# Patient Record
Sex: Male | Born: 1960 | Race: White | Hispanic: No | Marital: Married | Smoking: Never smoker
Health system: Southern US, Community
[De-identification: ages and names within clinical notes are randomized; demographics above are authoritative.]

## PROBLEM LIST (undated history)

## (undated) DIAGNOSIS — K219 Gastro-esophageal reflux disease without esophagitis: Secondary | ICD-10-CM

## (undated) DIAGNOSIS — IMO0001 Reserved for inherently not codable concepts without codable children: Secondary | ICD-10-CM

## (undated) DIAGNOSIS — K4090 Unilateral inguinal hernia, without obstruction or gangrene, not specified as recurrent: Secondary | ICD-10-CM

## (undated) DIAGNOSIS — Z9889 Other specified postprocedural states: Secondary | ICD-10-CM

## (undated) DIAGNOSIS — R011 Cardiac murmur, unspecified: Secondary | ICD-10-CM

## (undated) DIAGNOSIS — R112 Nausea with vomiting, unspecified: Secondary | ICD-10-CM

## (undated) HISTORY — PX: NO PAST SURGERIES: SHX2092

## (undated) HISTORY — DX: Other specified postprocedural states: Z98.890

## (undated) HISTORY — PX: INGUINAL HERNIA REPAIR: SUR1180

## (undated) HISTORY — PX: HERNIA REPAIR: SHX51

## (undated) HISTORY — PX: COLONOSCOPY: SHX174

## (undated) HISTORY — DX: Nausea with vomiting, unspecified: R11.2

## (undated) HISTORY — DX: Unilateral inguinal hernia, without obstruction or gangrene, not specified as recurrent: K40.90

---

## 2009-01-18 HISTORY — PX: COLONOSCOPY: SHX174

## 2009-11-19 ENCOUNTER — Encounter (INDEPENDENT_AMBULATORY_CARE_PROVIDER_SITE_OTHER): Payer: Self-pay | Admitting: *Deleted

## 2009-12-17 ENCOUNTER — Encounter (INDEPENDENT_AMBULATORY_CARE_PROVIDER_SITE_OTHER): Payer: Self-pay | Admitting: *Deleted

## 2009-12-18 ENCOUNTER — Ambulatory Visit: Payer: Self-pay | Admitting: Internal Medicine

## 2010-01-01 ENCOUNTER — Ambulatory Visit: Payer: Self-pay | Admitting: Internal Medicine

## 2010-02-17 NOTE — Letter (Signed)
Summary: [facility] Instructions  [facility]  [address] [place], [place] [address]   Phone: [phone]  Fax: [phone]       BRASON BERTHELOT    May 05, 1960    MRN: [id number]        Procedure Day Dorna Bloom:  thursday 01/01/2010      Arrival Time:  9:00 am     Procedure Time:  10:00 am     Location of Procedure:                    _x _  [facility] (4th Floor)                        PREPARATION FOR COLONOSCOPY WITH MOVIPREP   Starting 5 days prior to your procedure Saturday 10/10 do not eat nuts, seeds, popcorn, corn, beans, peas,  salads, or any raw vegetables.  Do not take any fiber supplements (e.g. Metamucil, Citrucel, and Benefiber).  THE DAY BEFORE YOUR PROCEDURE         DATE: Wednesday 12/14  1.  Drink clear liquids the entire day-NO SOLID FOOD  2.  Do not drink anything colored red or purple.  Avoid juices with pulp.  No orange juice.  3.  Drink at least 64 oz. (8 glasses) of fluid/clear liquids during the day to prevent dehydration and help the prep work efficiently.  CLEAR LIQUIDS INCLUDE: Water Jello Ice Popsicles Tea (sugar ok, no milk/cream) Powdered fruit flavored drinks Coffee (sugar ok, no milk/cream) Gatorade Juice: apple, white grape, white cranberry  Lemonade Clear bullion, consomm, broth Carbonated beverages (any kind) Strained chicken noodle soup Hard Candy                             4.  In the morning, mix first dose of MoviPrep solution:    Empty 1 Pouch A and 1 Pouch B into the disposable container    Add lukewarm drinking water to the top line of the container. Mix to dissolve    Refrigerate (mixed solution should be used within 24 hrs)  5.  Begin drinking the prep at 5:00 p.m. The MoviPrep container is divided by 4 marks.   Every 15 minutes drink the solution down to the next mark (approximately 8 oz) until the full liter is complete.   6.  Follow completed prep with 16 oz of clear liquid of your  choice (Nothing red or purple).  Continue to drink clear liquids until bedtime.  7.  Before going to bed, mix second dose of MoviPrep solution:    Empty 1 Pouch A and 1 Pouch B into the disposable container    Add lukewarm drinking water to the top line of the container. Mix to dissolve    Refrigerate  THE DAY OF YOUR PROCEDURE      DATE: Thursday 12/15  Beginning at 5:00 a.m. (5 hours before procedure):         1. Every 15 minutes, drink the solution down to the next mark (approx 8 oz) until the full liter is complete.  2. Follow completed prep with 16 oz. of clear liquid of your choice.    3. You may drink clear liquids until 8:00 am (2 HOURS BEFORE PROCEDURE).   MEDICATION INSTRUCTIONS  Unless otherwise instructed, you should take regular prescription medications with a small sip of water   as early as possible the  morning of your procedure.           OTHER INSTRUCTIONS  You will need a responsible adult at least 51 years of age to accompany you and drive you home.   This person must remain in the waiting room during your procedure.  Wear loose fitting clothing that is easily removed.  Leave jewelry and other valuables at home.  However, you may wish to bring a book to read or  an iPod/MP3 player to listen to music as you wait for your procedure to start.  Remove all body piercing jewelry and leave at home.  Total time from sign-in until discharge is approximately 2-3 hours.  You should go home directly after your procedure and rest.  You can resume normal activities the  day after your procedure.  The day of your procedure you should not:   Drive   Make legal decisions   Operate machinery   Drink alcohol   Return to work  You will receive specific instructions about eating, activities and medications before you leave.    The above instructions have been reviewed and explained to me by   Ezra Sites RN  December 18, 2009 1:10 PM     I fully  understand and can verbalize these instructions _____________________________ Date _________

## 2010-02-17 NOTE — Letter (Signed)
Summary: Pre Visit Letter Revised  [facility]  [address] [place], [place] [address]   Phone: [phone]  Fax: [phone]        11/19/2009 MRN: [id number] Kirk Campbell [address] [place], [place]  [address]             Procedure Date:  01-01-10   Welcome to the Gastroenterology Division at [facility].    You are scheduled to see a nurse for your pre-procedure visit on 12-18-09 at 1:00p.m. on the 3rd floor at [facility], [address]  We ask that you try to arrive at our office 15 minutes prior to your appointment time to allow for check-in.  Please take a minute to review the attached form.  If you answer "Yes" to one or more of the questions on the first page, we ask that you call the person listed at your earliest opportunity.  If you answer "No" to all of the questions, please complete the rest of the form and bring it to your appointment.    Your nurse visit will consist of discussing your medical and surgical history, your immediate family medical history, and your medications.   If you are unable to list all of your medications on the form, please bring the medication bottles to your appointment and we will list them.  We will need to be aware of both prescribed and over the counter drugs.  We will need to know exact dosage information as well.    Please be prepared to read and sign documents such as consent forms, a financial agreement, and acknowledgement forms.  If necessary, and with your consent, a friend or relative is welcome to sit-in on the nurse visit with you.  Please bring your insurance card so that we may make a copy of it.  If your insurance requires a referral to see a specialist, please bring your referral form from your primary care physician.  No co-pay is required for this nurse visit.     If you cannot keep your appointment, please call [phone] to cancel or reschedule prior to your appointment date.  This  allows [place] the opportunity to schedule an appointment for another patient in need of care.    Thank you for choosing [facility] for your medical needs.  We appreciate the opportunity to care for you.  Please visit [place] at our website  to learn more about our practice.  Sincerely, The Gastroenterology Division

## 2010-02-17 NOTE — Miscellaneous (Signed)
Summary: [facility]  Clinical Lists Changes  Medications: Added new medication of MOVIPREP 100 GM  SOLR (PEG-KCL-NACL-NASULF-NA ASC-C) As per prep instructions. - Signed Rx of MOVIPREP 100 GM  SOLR (PEG-KCL-NACL-NASULF-NA ASC-C) As per prep instructions.;  #1 x 0;  Signed;  Entered by: Ezra Sites RN;  Authorized by: Iva Boop MD, [facility];  Method used: Electronically to [facility] [address] [place], [place], [place]  [address], Ph: [phone] or [phone], Fax: [phone] Observations: Added new observation of NKA: T (12/18/2009 12:28)    Prescriptions: MOVIPREP 100 GM  SOLR (PEG-KCL-NACL-NASULF-NA ASC-C) As per prep instructions.  #1 x 0   Entered by:   Ezra Sites RN   Authorized by:   Iva Boop MD, [facility]   Signed by:   Ezra Sites RN on 12/18/2009   Method used:   Electronically to        [facility]       [address]       [place], [place]  [address]       Ph: [phone] or [phone]       Fax: [phone]   RxID:   [id number]

## 2010-02-19 NOTE — Procedures (Signed)
Summary: Colonoscopy  Patient: Kirk Campbell Note: All result statuses are Final unless otherwise noted.  Tests: (1) Colonoscopy (COL)   COL Colonoscopy           DONE     [facility]     [address]     [place], [place]  [address]           COLONOSCOPY PROCEDURE REPORT           PATIENT:  Kirk Campbell, Kirk Campbell  MR#:  [id number]     BIRTHDATE:  1960/07/02, 49 yrs. old  GENDER:  male     ENDOSCOPIST:  Iva Boop, MD, [facility]     REF. BY:  Theadora Rama, M.D.     PROCEDURE DATE:  01/01/2010     PROCEDURE:  Colonoscopy 295[address]     ASA CLASS:  Class I     INDICATIONS:  Routine Risk Screening     MEDICATIONS:   Fentanyl 75 mcg IV, Versed 6 mg IV           DESCRIPTION OF PROCEDURE:   After the risks benefits and     alternatives of the procedure were thoroughly explained, informed     consent was obtained.  Digital rectal exam was performed and     revealed no abnormalities and normal prostate.   The [id number] [id number]     endoscope was introduced through the anus and advanced to the     cecum, which was identified by both the appendix and ileocecal     valve, without limitations.  The quality of the prep was     excellent, using MoviPrep.  The instrument was then slowly     withdrawn as the colon was fully examined. Insertion: 1:55 minutes     Withdrawal: 7:45 minutes     <<PROCEDUREIMAGES>>           FINDINGS:  Moderate diverticulosis was found in the sigmoid colon.     This was otherwise a normal examination of the colon.     Retroflexed views in the rectum revealed no abnormalities.    The     scope was then withdrawn from the patient and the procedure     completed.           COMPLICATIONS:  None     ENDOSCOPIC IMPRESSION:     1) Moderate diverticulosis in the sigmoid colon     2) Otherwise normal examination with excellent prep           REPEAT EXAM:  In 10 year(s) for routine screening colonoscopy.           Iva Boop, MD, [facility]           CC:  [facility] Theadora Rama, MD) and The Patient                 n.     eSIGNED:   Iva Boop at 01/01/2010 10:56 AM           Thornell Mule, [id number]  Note: An exclamation mark (!) indicates a result that was not dispersed into the flowsheet. Document Creation Date: 01/01/2010 10:56 AM _______________________________________________________________________  (1) Order result status: Final Collection or observation date-time: 01/01/2010 10:43 Requested date-time:  Receipt date-time:  Reported date-time:  Referring Physician:   Ordering Physician: Stan Head [phone]) Specimen Source:  Source: Launa Grill Order Number: [id number] Lab site:   Appended Document: Colonoscopy    Clinical Lists Changes  Observations: Added new observation of COLONNXTDUE: 12/2019 (01/01/2010 11:00)

## 2012-10-30 DIAGNOSIS — M25559 Pain in unspecified hip: Secondary | ICD-10-CM | POA: Insufficient documentation

## 2014-10-15 ENCOUNTER — Other Ambulatory Visit: Payer: Self-pay | Admitting: Orthopaedic Surgery

## 2014-10-15 DIAGNOSIS — M542 Cervicalgia: Secondary | ICD-10-CM

## 2014-10-16 ENCOUNTER — Other Ambulatory Visit: Payer: Self-pay

## 2014-10-17 ENCOUNTER — Other Ambulatory Visit: Payer: Self-pay | Admitting: *Deleted

## 2014-10-17 ENCOUNTER — Encounter: Payer: Self-pay | Admitting: *Deleted

## 2014-10-17 ENCOUNTER — Telehealth: Payer: Self-pay | Admitting: Internal Medicine

## 2014-10-17 NOTE — Telephone Encounter (Signed)
Records rec via fax from [facility], placed in chart prep bin for 10/21/14 appointment with Nevin Bloodgood Ross,MD

## 2014-10-21 ENCOUNTER — Encounter: Payer: Self-pay | Admitting: Internal Medicine

## 2014-10-21 ENCOUNTER — Ambulatory Visit (INDEPENDENT_AMBULATORY_CARE_PROVIDER_SITE_OTHER): Payer: BLUE CROSS/BLUE SHIELD | Admitting: Internal Medicine

## 2014-10-21 VITALS — BP 122/100 | HR 57 | Ht 70.0 in | Wt 183.1 lb

## 2014-10-21 DIAGNOSIS — R0989 Other specified symptoms and signs involving the circulatory and respiratory systems: Secondary | ICD-10-CM | POA: Diagnosis not present

## 2014-10-21 DIAGNOSIS — R011 Cardiac murmur, unspecified: Secondary | ICD-10-CM

## 2014-10-21 NOTE — Patient Instructions (Signed)
Your physician recommends that you continue on your current medications as directed. Please refer to the Current Medication list given to you today. Your physician has requested that you have an echocardiogram. Echocardiography is a painless test that uses sound waves to create images of your heart. It provides your doctor with information about the size and shape of your heart and how well your heart's chambers and valves are working. This procedure takes approximately one hour. There are no restrictions for this procedure.  Your physician has requested that you have a carotid duplex. This test is an ultrasound of the carotid arteries in your neck. It looks at blood flow through these arteries that supply the brain with blood. Allow one hour for this exam. There are no restrictions or special instructions.  Your physician wants you to follow-up in: [place].  You will receive a reminder letter in the mail two months in advance. If you don't receive a letter, please call our office to schedule the follow-up appointment.

## 2014-10-21 NOTE — Progress Notes (Signed)
   Cardiology Office Note   Date:  10/21/2014   ID:  Kirk Campbell, DOB 08/15/1960, MRN [id number]  PCP:  Myrlene Broker, MD  Cardiologist:   Dorris Carnes, MD   No chief complaint on file.     History of Present Illness: Kirk Campbell is a 54 y.o. male with a history of Murmur  He is followed in IM  Murmur was   commented on in past    Pt walks some   [place] [employer]   No PNd   Occasional CP without activity  Lasts seconds        Current Outpatient Prescriptions  Medication Sig Dispense Refill  . aspirin 81 MG tablet Take 81 mg by mouth daily.    . cetirizine (ZYRTEC) 10 MG tablet Take 10 mg by mouth daily.    Marland Kitchen esomeprazole (NEXIUM) 40 MG capsule Take 40 mg by mouth daily.  3  . Multiple Vitamin (MULTI VITAMIN MENS PO) Take 1 tablet by mouth daily.     No current facility-administered medications for this visit.    Allergies:   Review of patient's allergies indicates no known allergies.   Past Medical History  Diagnosis Date  . Right inguinal hernia     Past Surgical History  Procedure Laterality Date  . No past surgeries       Social History:  The patient  reports that he has never smoked. He has never used smokeless tobacco. He reports that he does not drink alcohol.   Family History:  The patient's family history is negative for Cancer, Heart attack, Diabetes, Hyperlipidemia, and Hypertension.    ROS:  Please see the history of present illness. All other systems are reviewed and  Negative to the above problem except as noted.    PHYSICAL EXAM: VS:  BP 122/100 mmHg  Pulse 57  Ht 5\' 10"  (1.778 m)  Wt 183 lb 1.9 oz (83.063 kg)  BMI 26.28 kg/m2  GEN: Well nourished, well developed, in no acute distress HEENT: normal Neck: no JVD, Murmur L neck or masses Cardiac: RRR; Gr III/VI systolic murmur  No, rubs, or gallops,no edema  Respiratory:  clear to auscultation bilaterally, normal work of breathing GI: soft, nontender, nondistended, + BS  No  hepatomegaly  MS: no deformity Moving all extremities   Skin: warm and dry, no rash Neuro:  Strength and sensation are intact Psych: euthymic mood, full affect   EKG:  EKG is ordered today.  SB 57 bpm     Lipid Panel No results found for: CHOL, TRIG, HDL, CHOLHDL, VLDL, LDLCALC, LDLDIRECT    Wt Readings from Last 3 Encounters:  10/21/14 183 lb 1.9 oz (83.063 kg)      ASSESSMENT AND PLAN:  1  Murmur  Prominent on exam  Most likely due to bicuspid AV  It is very prominent in L neck Would set up for echo to evaluate  Also set up for carotid USN   2  CP  Atypical  I do not think cardiac  Disposition:   FU with me tentatively in 1 year    Signed, Dorris Carnes, MD  10/21/2014 10:46 AM    [address], [place], [place]  [address] Phone: [phone]; Fax: [phone]

## 2014-11-07 ENCOUNTER — Other Ambulatory Visit: Payer: Self-pay | Admitting: Internal Medicine

## 2014-11-07 DIAGNOSIS — R0989 Other specified symptoms and signs involving the circulatory and respiratory systems: Secondary | ICD-10-CM

## 2014-11-15 ENCOUNTER — Other Ambulatory Visit: Payer: Self-pay | Admitting: Internal Medicine

## 2014-11-15 ENCOUNTER — Ambulatory Visit (HOSPITAL_COMMUNITY)
Admission: RE | Admit: 2014-11-15 | Discharge: 2014-11-15 | Disposition: A | Discharge status: Home / Self Care | Payer: BLUE CROSS/BLUE SHIELD | Source: Ambulatory Visit | Attending: Cardiovascular Disease | Admitting: Cardiovascular Disease

## 2014-11-15 ENCOUNTER — Ambulatory Visit (HOSPITAL_BASED_OUTPATIENT_CLINIC_OR_DEPARTMENT_OTHER): Payer: BLUE CROSS/BLUE SHIELD

## 2014-11-15 ENCOUNTER — Inpatient Hospital Stay (HOSPITAL_COMMUNITY): Admission: RE | Admit: 2014-11-15 | Payer: BLUE CROSS/BLUE SHIELD | Source: Ambulatory Visit

## 2014-11-15 ENCOUNTER — Other Ambulatory Visit: Payer: Self-pay

## 2014-11-15 DIAGNOSIS — I5189 Other ill-defined heart diseases: Secondary | ICD-10-CM | POA: Insufficient documentation

## 2014-11-15 DIAGNOSIS — R011 Cardiac murmur, unspecified: Secondary | ICD-10-CM

## 2014-11-15 DIAGNOSIS — I352 Nonrheumatic aortic (valve) stenosis with insufficiency: Secondary | ICD-10-CM | POA: Insufficient documentation

## 2014-11-15 DIAGNOSIS — R0989 Other specified symptoms and signs involving the circulatory and respiratory systems: Secondary | ICD-10-CM | POA: Diagnosis not present

## 2014-11-15 DIAGNOSIS — I77810 Thoracic aortic ectasia: Secondary | ICD-10-CM | POA: Insufficient documentation

## 2014-11-15 DIAGNOSIS — I340 Nonrheumatic mitral (valve) insufficiency: Secondary | ICD-10-CM | POA: Diagnosis not present

## 2014-12-02 ENCOUNTER — Other Ambulatory Visit (HOSPITAL_COMMUNITY): Payer: Self-pay | Admitting: Orthopaedic Surgery

## 2014-12-06 ENCOUNTER — Telehealth: Payer: Self-pay | Admitting: *Deleted

## 2014-12-06 NOTE — Telephone Encounter (Signed)
Received request for pre op clearance from [employer]. Signed by Dr. [name]. Placed in nurse fax bin in medical records to be faxed.

## 2014-12-10 ENCOUNTER — Telehealth: Payer: Self-pay | Admitting: Internal Medicine

## 2014-12-10 NOTE — Telephone Encounter (Signed)
[employer]  Rn from [facility] calling to follow up on surg clearance- has not received anything from Friday. Please call back and discuss.

## 2014-12-10 NOTE — Telephone Encounter (Signed)
Called and left voicemail that clearance form is scanned into EPIC under media tab. Advised to call back if further questions or if they are unable to see this form we can fax again.

## 2014-12-20 ENCOUNTER — Encounter (HOSPITAL_COMMUNITY): Payer: Self-pay

## 2014-12-20 ENCOUNTER — Encounter (HOSPITAL_COMMUNITY)
Admission: RE | Admit: 2014-12-20 | Discharge: 2014-12-20 | Disposition: A | Discharge status: Home / Self Care | Payer: BLUE CROSS/BLUE SHIELD | Source: Ambulatory Visit | Attending: Orthopaedic Surgery | Admitting: Orthopaedic Surgery

## 2014-12-20 DIAGNOSIS — M47812 Spondylosis without myelopathy or radiculopathy, cervical region: Secondary | ICD-10-CM | POA: Diagnosis not present

## 2014-12-20 DIAGNOSIS — Z01812 Encounter for preprocedural laboratory examination: Secondary | ICD-10-CM | POA: Diagnosis not present

## 2014-12-20 DIAGNOSIS — Z01818 Encounter for other preprocedural examination: Secondary | ICD-10-CM | POA: Insufficient documentation

## 2014-12-20 HISTORY — DX: Gastro-esophageal reflux disease without esophagitis: K21.9

## 2014-12-20 HISTORY — DX: Reserved for inherently not codable concepts without codable children: IMO0001

## 2014-12-20 LAB — COMPREHENSIVE METABOLIC PANEL
ALT: 18 U/L (ref 17–63)
AST: 30 U/L (ref 15–41)
Albumin: 4.5 g/dL (ref 3.5–5.0)
Alkaline Phosphatase: 53 U/L (ref 38–126)
Anion gap: 8 (ref 5–15)
BUN: 17 mg/dL (ref 6–20)
CHLORIDE: 107 mmol/L (ref 101–111)
CO2: 25 mmol/L (ref 22–32)
Calcium: 9.6 mg/dL (ref 8.9–10.3)
Creatinine, Ser: 0.9 mg/dL (ref 0.61–1.24)
Glucose, Bld: 93 mg/dL (ref 65–99)
POTASSIUM: 4.1 mmol/L (ref 3.5–5.1)
Sodium: 140 mmol/L (ref 135–145)
Total Bilirubin: 0.6 mg/dL (ref 0.3–1.2)
Total Protein: 7 g/dL (ref 6.5–8.1)

## 2014-12-20 LAB — URINALYSIS, ROUTINE W REFLEX MICROSCOPIC
Bilirubin Urine: NEGATIVE
Glucose, UA: NEGATIVE mg/dL
Hgb urine dipstick: NEGATIVE
KETONES UR: 15 mg/dL — AB
LEUKOCYTES UA: NEGATIVE
NITRITE: NEGATIVE
PROTEIN: NEGATIVE mg/dL
Specific Gravity, Urine: 1.012 (ref 1.005–1.030)
pH: 5.5 (ref 5.0–8.0)

## 2014-12-20 LAB — CBC
HCT: 45.7 % (ref 39.0–52.0)
Hemoglobin: 15.3 g/dL (ref 13.0–17.0)
MCH: 31 pg (ref 26.0–34.0)
MCHC: 33.5 g/dL (ref 30.0–36.0)
MCV: 92.5 fL (ref 78.0–100.0)
Platelets: 227 10*3/uL (ref 150–400)
RBC: 4.94 MIL/uL (ref 4.22–5.81)
RDW: 13.5 % (ref 11.5–15.5)
WBC: 7.3 10*3/uL (ref 4.0–10.5)

## 2014-12-20 LAB — SURGICAL PCR SCREEN
MRSA, PCR: NEGATIVE
Staphylococcus aureus: NEGATIVE

## 2014-12-20 LAB — PROTIME-INR
INR: 1.06 (ref 0.00–1.49)
PROTHROMBIN TIME: 14 s (ref 11.6–15.2)

## 2014-12-20 LAB — APTT: aPTT: 31 s (ref 24–37)

## 2014-12-20 MED ORDER — CHLORHEXIDINE GLUCONATE 4 % EX LIQD: 60.0000 mL | Freq: Once | CUTANEOUS | Status: DC

## 2014-12-20 NOTE — Pre-Procedure Instructions (Signed)
    Kirk Campbell  12/20/2014      [id number] [address], [place] [address] Phone: [phone] Fax: [phone]    Your procedure is scheduled on December 27, 2014.  Report to [facility] Admitting at 5:30 A.M.  Call this number if you have problems the morning of surgery:  [phone]   Remember:  Do not eat food or drink liquids after midnight.  Take these medicines the morning of surgery with A SIP OF WATER : cetirizine (ZYRTEC), esomeprazole (NEXIUM)   STOP HERBAL MEDICATIONS, ASPIRIN  AS OF TODAY   Do not wear jewelry.  Do not wear lotions, powders, or colognes.  You may wear deodorant.  Men may shave face and neck.  Do not bring valuables to the hospital.  [facility] is not responsible for any belongings or valuables.  Contacts, dentures or bridgework may not be worn into surgery.  Leave your suitcase in the car.  After surgery it may be brought to your room.  For patients admitted to the hospital, discharge time will be determined by your treatment team.  Patients discharged the day of surgery will not be allowed to drive home.   Name and phone number of your driver:    Special instructions:  "PREPARING FOR SURGERY"  Please read over the following fact sheets that you were given. Pain Booklet, Coughing and Deep Breathing and Surgical Site Infection Prevention

## 2014-12-26 MED ORDER — CEFAZOLIN SODIUM-DEXTROSE 2-3 GM-% IV SOLR
2.0000 g | INTRAVENOUS | Status: AC
  Administered 2014-12-27: 2 g via INTRAVENOUS
  Filled 2014-12-26: qty 50

## 2014-12-27 ENCOUNTER — Observation Stay (HOSPITAL_COMMUNITY)
Admission: RE | Admit: 2014-12-27 | Discharge: 2014-12-28 | Disposition: A | Discharge status: Home / Self Care | Payer: BLUE CROSS/BLUE SHIELD | Source: Ambulatory Visit | Attending: Orthopaedic Surgery | Admitting: Orthopaedic Surgery

## 2014-12-27 ENCOUNTER — Ambulatory Visit (HOSPITAL_COMMUNITY): Payer: BLUE CROSS/BLUE SHIELD

## 2014-12-27 ENCOUNTER — Encounter (HOSPITAL_COMMUNITY): Payer: Self-pay | Admitting: *Deleted

## 2014-12-27 ENCOUNTER — Ambulatory Visit (HOSPITAL_COMMUNITY): Payer: BLUE CROSS/BLUE SHIELD | Admitting: Anesthesiology

## 2014-12-27 ENCOUNTER — Encounter (HOSPITAL_COMMUNITY)
Admission: RE | Disposition: A | Discharge status: Home / Self Care | Payer: Self-pay | Source: Ambulatory Visit | Attending: Orthopaedic Surgery

## 2014-12-27 DIAGNOSIS — Z419 Encounter for procedure for purposes other than remedying health state, unspecified: Secondary | ICD-10-CM

## 2014-12-27 DIAGNOSIS — Z7982 Long term (current) use of aspirin: Secondary | ICD-10-CM | POA: Insufficient documentation

## 2014-12-27 DIAGNOSIS — M47812 Spondylosis without myelopathy or radiculopathy, cervical region: Secondary | ICD-10-CM | POA: Diagnosis not present

## 2014-12-27 DIAGNOSIS — M50223 Other cervical disc displacement at C6-C7 level: Principal | ICD-10-CM | POA: Insufficient documentation

## 2014-12-27 DIAGNOSIS — Z981 Arthrodesis status: Secondary | ICD-10-CM

## 2014-12-27 DIAGNOSIS — M50222 Other cervical disc displacement at C5-C6 level: Secondary | ICD-10-CM | POA: Insufficient documentation

## 2014-12-27 HISTORY — DX: Cardiac murmur, unspecified: R01.1

## 2014-12-27 HISTORY — PX: ANTERIOR CERVICAL DECOMP/DISCECTOMY FUSION: SHX1161

## 2014-12-27 SURGERY — ANTERIOR CERVICAL DECOMPRESSION/DISCECTOMY FUSION 2 LEVELS
Anesthesia: General

## 2014-12-27 MED ORDER — FENTANYL CITRATE (PF) 250 MCG/5ML IJ SOLN
INTRAMUSCULAR | Status: AC
  Filled 2014-12-27: qty 5

## 2014-12-27 MED ORDER — SUCCINYLCHOLINE CHLORIDE 20 MG/ML IJ SOLN
INTRAMUSCULAR | Status: AC
  Filled 2014-12-27: qty 1

## 2014-12-27 MED ORDER — CEFAZOLIN SODIUM 1-5 GM-% IV SOLN
1.0000 g | Freq: Three times a day (TID) | INTRAVENOUS | Status: AC
  Administered 2014-12-27 (×2): 1 g via INTRAVENOUS
  Filled 2014-12-27 (×3): qty 50

## 2014-12-27 MED ORDER — PROMETHAZINE HCL 25 MG/ML IJ SOLN
12.5000 mg | Freq: Four times a day (QID) | INTRAMUSCULAR | Status: DC | PRN
  Administered 2014-12-27: 12.5 mg via INTRAVENOUS
  Filled 2014-12-27 (×2): qty 1

## 2014-12-27 MED ORDER — ONDANSETRON HCL 4 MG/2ML IJ SOLN
4.0000 mg | INTRAMUSCULAR | Status: DC | PRN
  Administered 2014-12-27: 4 mg via INTRAVENOUS
  Filled 2014-12-27: qty 2

## 2014-12-27 MED ORDER — PHENOL 1.4 % MT LIQD: 1.0000 | OROMUCOSAL | Status: DC | PRN

## 2014-12-27 MED ORDER — LIDOCAINE HCL (CARDIAC) 20 MG/ML IV SOLN
INTRAVENOUS | Status: AC
  Filled 2014-12-27: qty 5

## 2014-12-27 MED ORDER — MENTHOL 3 MG MT LOZG: 1.0000 | LOZENGE | OROMUCOSAL | Status: DC | PRN

## 2014-12-27 MED ORDER — OXYCODONE-ACETAMINOPHEN 7.5-325 MG PO TABS: 1.0000 | ORAL_TABLET | Freq: Four times a day (QID) | ORAL | Status: DC | PRN

## 2014-12-27 MED ORDER — HYDROMORPHONE HCL 1 MG/ML IJ SOLN
0.2500 mg | INTRAMUSCULAR | Status: DC | PRN
  Administered 2014-12-27: 0.5 mg via INTRAVENOUS

## 2014-12-27 MED ORDER — GLYCOPYRROLATE 0.2 MG/ML IJ SOLN
INTRAMUSCULAR | Status: AC
  Filled 2014-12-27: qty 2

## 2014-12-27 MED ORDER — LACTATED RINGERS IV SOLN
INTRAVENOUS | Status: DC | PRN
  Administered 2014-12-27 (×2): via INTRAVENOUS

## 2014-12-27 MED ORDER — OXYCODONE-ACETAMINOPHEN 5-325 MG PO TABS
1.0000 | ORAL_TABLET | Freq: Four times a day (QID) | ORAL | Status: DC | PRN
  Administered 2014-12-27 – 2014-12-28 (×3): 2 via ORAL
  Filled 2014-12-27 (×3): qty 2

## 2014-12-27 MED ORDER — SODIUM CHLORIDE 0.9 % IJ SOLN: 3.0000 mL | INTRAMUSCULAR | Status: DC | PRN

## 2014-12-27 MED ORDER — LIDOCAINE HCL (CARDIAC) 20 MG/ML IV SOLN
INTRAVENOUS | Status: DC | PRN
  Administered 2014-12-27: 60 mg via INTRAVENOUS

## 2014-12-27 MED ORDER — HYDROMORPHONE HCL 1 MG/ML IJ SOLN
INTRAMUSCULAR | Status: AC
  Administered 2014-12-27: 1 mg via INTRAVENOUS
  Filled 2014-12-27: qty 1

## 2014-12-27 MED ORDER — SODIUM CHLORIDE 0.9 % IV SOLN: 250.0000 mL | INTRAVENOUS | Status: DC

## 2014-12-27 MED ORDER — PROPOFOL 10 MG/ML IV BOLUS
INTRAVENOUS | Status: DC | PRN
  Administered 2014-12-27: 130 mg via INTRAVENOUS
  Administered 2014-12-27: 30 mg via INTRAVENOUS

## 2014-12-27 MED ORDER — ACETAMINOPHEN 650 MG RE SUPP
650.0000 mg | RECTAL | Status: DC | PRN
Start: 2014-12-27 — End: 2014-12-28

## 2014-12-27 MED ORDER — ROCURONIUM BROMIDE 50 MG/5ML IV SOLN
INTRAVENOUS | Status: AC
  Filled 2014-12-27: qty 1

## 2014-12-27 MED ORDER — 0.9 % SODIUM CHLORIDE (POUR BTL) OPTIME
TOPICAL | Status: DC | PRN
  Administered 2014-12-27: 1000 mL

## 2014-12-27 MED ORDER — FENTANYL CITRATE (PF) 100 MCG/2ML IJ SOLN
INTRAMUSCULAR | Status: DC | PRN
  Administered 2014-12-27: 100 ug via INTRAVENOUS
  Administered 2014-12-27: 50 ug via INTRAVENOUS
  Administered 2014-12-27 (×2): 100 ug via INTRAVENOUS

## 2014-12-27 MED ORDER — PROMETHAZINE HCL 25 MG/ML IJ SOLN
INTRAMUSCULAR | Status: AC
  Filled 2014-12-27: qty 1

## 2014-12-27 MED ORDER — BUPIVACAINE HCL (PF) 0.5 % IJ SOLN
INTRAMUSCULAR | Status: AC
  Filled 2014-12-27: qty 30

## 2014-12-27 MED ORDER — ROCURONIUM BROMIDE 100 MG/10ML IV SOLN
INTRAVENOUS | Status: DC | PRN
  Administered 2014-12-27: 50 mg via INTRAVENOUS
  Administered 2014-12-27: 20 mg via INTRAVENOUS

## 2014-12-27 MED ORDER — THROMBIN 20000 UNITS EX SOLR
CUTANEOUS | Status: AC
  Filled 2014-12-27: qty 20000

## 2014-12-27 MED ORDER — PHENYLEPHRINE HCL 10 MG/ML IJ SOLN
10.0000 mg | INTRAVENOUS | Status: DC | PRN
  Administered 2014-12-27: 10 ug/min via INTRAVENOUS

## 2014-12-27 MED ORDER — DOCUSATE SODIUM 100 MG PO CAPS
100.0000 mg | ORAL_CAPSULE | Freq: Two times a day (BID) | ORAL | Status: DC
  Administered 2014-12-27 – 2014-12-28 (×2): 100 mg via ORAL
  Filled 2014-12-27 (×2): qty 1

## 2014-12-27 MED ORDER — POTASSIUM CHLORIDE IN NACL 20-0.45 MEQ/L-% IV SOLN
INTRAVENOUS | Status: DC
  Administered 2014-12-27: 14:00:00 via INTRAVENOUS
  Filled 2014-12-27 (×2): qty 1000

## 2014-12-27 MED ORDER — MIDAZOLAM HCL 5 MG/5ML IJ SOLN
INTRAMUSCULAR | Status: DC | PRN
  Administered 2014-12-27: 2 mg via INTRAVENOUS

## 2014-12-27 MED ORDER — ARTIFICIAL TEARS OP OINT
TOPICAL_OINTMENT | OPHTHALMIC | Status: DC | PRN
  Administered 2014-12-27: 1 via OPHTHALMIC

## 2014-12-27 MED ORDER — PANTOPRAZOLE SODIUM 40 MG PO TBEC
40.0000 mg | DELAYED_RELEASE_TABLET | Freq: Every day | ORAL | Status: DC
Start: 2014-12-27 — End: 2014-12-28
  Administered 2014-12-27 – 2014-12-28 (×2): 40 mg via ORAL
  Filled 2014-12-27 (×2): qty 1

## 2014-12-27 MED ORDER — EPHEDRINE SULFATE 50 MG/ML IJ SOLN
INTRAMUSCULAR | Status: AC
  Filled 2014-12-27: qty 1

## 2014-12-27 MED ORDER — ACETAMINOPHEN 325 MG PO TABS
650.0000 mg | ORAL_TABLET | ORAL | Status: DC | PRN
  Administered 2014-12-28: 650 mg via ORAL
  Filled 2014-12-27: qty 2

## 2014-12-27 MED ORDER — METHOCARBAMOL 500 MG PO TABS: 500.0000 mg | ORAL_TABLET | Freq: Four times a day (QID) | ORAL | Status: DC | PRN

## 2014-12-27 MED ORDER — HYDROMORPHONE HCL 1 MG/ML IJ SOLN
0.5000 mg | INTRAMUSCULAR | Status: DC | PRN
Start: 2014-12-27 — End: 2014-12-28
  Administered 2014-12-27 – 2014-12-28 (×2): 1 mg via INTRAVENOUS
  Filled 2014-12-27 (×2): qty 1

## 2014-12-27 MED ORDER — BUPIVACAINE-EPINEPHRINE (PF) 0.25% -1:200000 IJ SOLN
INTRAMUSCULAR | Status: AC
  Filled 2014-12-27: qty 30

## 2014-12-27 MED ORDER — ARTIFICIAL TEARS OP OINT
TOPICAL_OINTMENT | OPHTHALMIC | Status: AC
  Filled 2014-12-27: qty 3.5

## 2014-12-27 MED ORDER — ONDANSETRON HCL 4 MG/2ML IJ SOLN
INTRAMUSCULAR | Status: DC | PRN
  Administered 2014-12-27: 4 mg via INTRAVENOUS

## 2014-12-27 MED ORDER — SODIUM CHLORIDE 0.9 % IJ SOLN: 3.0000 mL | Freq: Two times a day (BID) | INTRAMUSCULAR | Status: DC

## 2014-12-27 MED ORDER — SODIUM CHLORIDE 0.9 % IJ SOLN
INTRAMUSCULAR | Status: AC
  Filled 2014-12-27: qty 10

## 2014-12-27 MED ORDER — POLYETHYLENE GLYCOL 3350 17 G PO PACK: 17.0000 g | PACK | Freq: Every day | ORAL | Status: DC | PRN

## 2014-12-27 MED ORDER — METHOCARBAMOL 500 MG PO TABS
500.0000 mg | ORAL_TABLET | Freq: Four times a day (QID) | ORAL | Status: DC | PRN
  Administered 2014-12-27: 500 mg via ORAL
  Filled 2014-12-27 (×2): qty 1

## 2014-12-27 MED ORDER — BUPIVACAINE-EPINEPHRINE 0.25% -1:200000 IJ SOLN
INTRAMUSCULAR | Status: DC | PRN
  Administered 2014-12-27: 6 mL

## 2014-12-27 MED ORDER — SUGAMMADEX SODIUM 200 MG/2ML IV SOLN
INTRAVENOUS | Status: AC
  Filled 2014-12-27: qty 2

## 2014-12-27 MED ORDER — METHOCARBAMOL 1000 MG/10ML IJ SOLN
500.0000 mg | Freq: Four times a day (QID) | INTRAVENOUS | Status: DC | PRN
  Administered 2014-12-27: 500 mg via INTRAVENOUS
  Filled 2014-12-27 (×2): qty 5

## 2014-12-27 MED ORDER — THROMBIN 20000 UNITS EX SOLR
CUTANEOUS | Status: DC | PRN
  Administered 2014-12-27: 20 mL via TOPICAL

## 2014-12-27 MED ORDER — PROMETHAZINE HCL 25 MG/ML IJ SOLN
6.2500 mg | Freq: Once | INTRAMUSCULAR | Status: AC
  Administered 2014-12-27: 6.25 mg via INTRAVENOUS

## 2014-12-27 MED ORDER — LORATADINE 10 MG PO TABS
10.0000 mg | ORAL_TABLET | Freq: Every day | ORAL | Status: DC
Start: 2014-12-27 — End: 2014-12-28
  Administered 2014-12-28: 10 mg via ORAL
  Filled 2014-12-27 (×2): qty 1

## 2014-12-27 MED ORDER — MIDAZOLAM HCL 2 MG/2ML IJ SOLN
INTRAMUSCULAR | Status: AC
  Filled 2014-12-27: qty 2

## 2014-12-27 MED ORDER — SUGAMMADEX SODIUM 200 MG/2ML IV SOLN
INTRAVENOUS | Status: DC | PRN
  Administered 2014-12-27: 175 mg via INTRAVENOUS

## 2014-12-27 MED ORDER — ONDANSETRON HCL 4 MG/2ML IJ SOLN
INTRAMUSCULAR | Status: AC
  Filled 2014-12-27: qty 2

## 2014-12-27 SURGICAL SUPPLY — 54 items
BENZOIN TINCTURE PRP APPL 2/3 (GAUZE/BANDAGES/DRESSINGS) ×2 IMPLANT
BIT DRILL SRG 14X2.2XFLT CHK (BIT) ×1 IMPLANT
BIT DRL SRG 14X2.2XFLT CHK (BIT) ×1
BLADE SURG ROTATE 9660 (MISCELLANEOUS) IMPLANT
BONE CERV LORDOTIC 14.5X12X7 (Bone Implant) ×4 IMPLANT
BUR ROUND FLUTED 4 SOFT TCH (BURR) IMPLANT
COLLAR CERV LO CONTOUR FIRM DE (SOFTGOODS) IMPLANT
CORDS BIPOLAR (ELECTRODE) ×2 IMPLANT
COVER SURGICAL LIGHT HANDLE (MISCELLANEOUS) ×2 IMPLANT
CRADLE DONUT ADULT HEAD (MISCELLANEOUS) ×2 IMPLANT
DRAPE C-ARM 42X72 X-RAY (DRAPES) ×2 IMPLANT
DRAPE MICROSCOPE LEICA (MISCELLANEOUS) ×2 IMPLANT
DRAPE PROXIMA HALF (DRAPES) ×2 IMPLANT
DRILL BIT SKYLINE 14MM (BIT) ×2
DRSG EMULSION OIL 3X3 NADH (GAUZE/BANDAGES/DRESSINGS) ×2 IMPLANT
DURAPREP 6ML APPLICATOR 50/CS (WOUND CARE) ×2 IMPLANT
ELECT COATED BLADE 2.86 ST (ELECTRODE) ×2 IMPLANT
ELECT REM PT RETURN 9FT ADLT (ELECTROSURGICAL) ×2
ELECTRODE REM PT RTRN 9FT ADLT (ELECTROSURGICAL) ×1 IMPLANT
EVACUATOR 1/8 PVC DRAIN (DRAIN) ×2 IMPLANT
GAUZE SPONGE 4X4 12PLY STRL (GAUZE/BANDAGES/DRESSINGS) ×2 IMPLANT
GAUZE XEROFORM 1X8 LF (GAUZE/BANDAGES/DRESSINGS) IMPLANT
GLOVE BIOGEL PI IND STRL 8 (GLOVE) ×2 IMPLANT
GLOVE BIOGEL PI INDICATOR 8 (GLOVE) ×2
GLOVE ORTHO TXT STRL SZ7.5 (GLOVE) ×4 IMPLANT
GOWN STRL REUS W/ TWL LRG LVL3 (GOWN DISPOSABLE) ×1 IMPLANT
GOWN STRL REUS W/ TWL XL LVL3 (GOWN DISPOSABLE) ×1 IMPLANT
GOWN STRL REUS W/TWL 2XL LVL3 (GOWN DISPOSABLE) ×2 IMPLANT
GOWN STRL REUS W/TWL LRG LVL3 (GOWN DISPOSABLE) ×2
GOWN STRL REUS W/TWL XL LVL3 (GOWN DISPOSABLE) ×2
HEAD HALTER (SOFTGOODS) ×2 IMPLANT
HEMOSTAT SURGICEL 2X14 (HEMOSTASIS) IMPLANT
KIT BASIN OR (CUSTOM PROCEDURE TRAY) ×2 IMPLANT
KIT ROOM TURNOVER OR (KITS) ×2 IMPLANT
NEEDLE 25GX 5/8IN NON SAFETY (NEEDLE) ×2 IMPLANT
NS IRRIG 1000ML POUR BTL (IV SOLUTION) ×2 IMPLANT
PACK ORTHO CERVICAL (CUSTOM PROCEDURE TRAY) ×2 IMPLANT
PAD ARMBOARD 7.5X6 YLW CONV (MISCELLANEOUS) ×4 IMPLANT
PATTIES SURGICAL .5 X.5 (GAUZE/BANDAGES/DRESSINGS) IMPLANT
PIN TEMP SKYLINE THREADED (PIN) ×2 IMPLANT
PLATE SKYLINE 2 LEVEL 34MM (Plate) ×2 IMPLANT
RESTRAINT LIMB HOLDER UNIV (RESTRAINTS) IMPLANT
SCREW VAR SELF TAP SKYLINE 14M (Screw) ×12 IMPLANT
SPONGE GAUZE 4X4 12PLY STER LF (GAUZE/BANDAGES/DRESSINGS) ×2 IMPLANT
STRIP CLOSURE SKIN 1/2X4 (GAUZE/BANDAGES/DRESSINGS) ×2 IMPLANT
SURGIFLO W/THROMBIN 8M KIT (HEMOSTASIS) IMPLANT
SUT BONE WAX W31G (SUTURE) ×2 IMPLANT
SUT VIC AB 3-0 X1 27 (SUTURE) ×2 IMPLANT
SUT VICRYL 4-0 PS2 18IN ABS (SUTURE) ×4 IMPLANT
SYR 30ML SLIP (SYRINGE) ×2 IMPLANT
TOWEL OR 17X24 6PK STRL BLUE (TOWEL DISPOSABLE) ×2 IMPLANT
TOWEL OR 17X26 10 PK STRL BLUE (TOWEL DISPOSABLE) ×2 IMPLANT
TRAY FOLEY CATH 16FR SILVER (SET/KITS/TRAYS/PACK) IMPLANT
WATER STERILE IRR 1000ML POUR (IV SOLUTION) ×2 IMPLANT

## 2014-12-27 NOTE — Progress Notes (Signed)
Pt complaining of left forearm pain as well as tingling down his right shoulder/arm when he stands up. Called the on-call orthopedic doctor and left a message. Dr. Sharol Given returned my call and advised that the sensation was likely due to swelling from the surgery. Pt also had a small, clear emesis. Administered 12.5 mg of phenergan IV and repositioned pt. Will continue to monitor.

## 2014-12-27 NOTE — Interval H&P Note (Signed)
History and Physical Interval Note:  12/27/2014 7:27 AM  Kirk Campbell  has presented today for surgery, with the diagnosis of C5-6, C6-7 Spondylosis, Stenosis  The various methods of treatment have been discussed with the patient and family. After consideration of risks, benefits and other options for treatment, the patient has consented to  Procedure(s): C5-6, C6-7 Anterior Cervical Discectomy and Fusion, Allograft, Plate (N/A) as a surgical intervention .  The patient's history has been reviewed, patient examined, no change in status, stable for surgery.  I have reviewed the patient's chart and labs.  Questions were answered to the patient's satisfaction.     [name]

## 2014-12-27 NOTE — H&P (Signed)
Godwin Jaracz is an 54 y.o. male.   Chief Complaint: neck and right arm pain and weakness HPI: 54 yo male with cervical spondylosis C5-6 , C6-7 with failed conservative tx. Prednisone pack, therapy, NSAIDs.   Past Medical History  Diagnosis Date  . Right inguinal hernia   . Reflux   . Heart murmur     Past Surgical History  Procedure Laterality Date  . No past surgeries      Family History  Problem Relation Age of Onset  . Cancer Neg Hx   . Heart attack Neg Hx   . Diabetes Neg Hx   . Hyperlipidemia Neg Hx   . Hypertension Neg Hx    Social History:  reports that he has never smoked. He has never used smokeless tobacco. He reports that he does not drink alcohol. His drug history is not on file.  Allergies: No Known Allergies  Medications Prior to Admission  Medication Sig Dispense Refill  . aspirin 81 MG tablet Take 81 mg by mouth daily.    . cetirizine (ZYRTEC) 10 MG tablet Take 10 mg by mouth daily.    Marland Kitchen doxylamine, Sleep, (UNISOM) 25 MG tablet Take 25 mg by mouth at bedtime as needed.    Marland Kitchen esomeprazole (NEXIUM) 40 MG capsule Take 40 mg by mouth daily.  3  . Multiple Vitamin (MULTI VITAMIN MENS PO) Take 1 tablet by mouth daily.      No results found for this or any previous visit (from the past 48 hour(s)). No results found.  Review of Systems  Constitutional: Negative for fever, chills and weight loss.  HENT: Negative for tinnitus.   Respiratory: Negative for cough.   Cardiovascular: Negative for chest pain.  Gastrointestinal: Negative for heartburn and vomiting.  Musculoskeletal: Positive for neck pain. Negative for myalgias and falls.  Skin: Negative for itching and rash.  Neurological: Negative for dizziness.  Endo/Heme/Allergies: Does not bruise/bleed easily.  Psychiatric/Behavioral: Negative for depression and substance abuse.    Blood pressure 140/71, pulse 71, temperature 97.9 F (36.6 C), temperature source Oral, resp. rate 18, SpO2 97 %. Physical  Exam  Constitutional: He is oriented to person, place, and time. He appears well-developed and well-nourished.  HENT:  Head: Normocephalic.  Eyes: Pupils are equal, round, and reactive to light.  Neck: Normal range of motion.  Cardiovascular: Normal rate and regular rhythm.   Respiratory: Effort normal.  GI: Soft. Bowel sounds are normal. He exhibits no distension. There is no tenderness.  Musculoskeletal:  Decreased sensation radial side of right hand. Reflexes intact.   Neurological: He is alert and oriented to person, place, and time.  Skin: Skin is warm and dry. No erythema.  Psychiatric: He has a normal mood and affect. His behavior is normal. Judgment and thought content normal.     Assessment/Plan 2 level spondylosis with disc protrusion failed conservative tx for ACDF, allograft and plate. Procedure discussed all ?'s answered.   Mozel Burdett C 12/27/2014, 7:23 AM

## 2014-12-27 NOTE — Transfer of Care (Signed)
Immediate Anesthesia Transfer of Care Note  Patient: Kirk Campbell  Procedure(s) Performed: Procedure(s): C5-6, C6-7 Anterior Cervical Discectomy and Fusion, Allograft, Plate (N/A)  Patient Location: PACU  Anesthesia Type:General  Level of Consciousness: awake, alert , oriented and patient cooperative  Airway & Oxygen Therapy: Patient Spontanous Breathing and Patient connected to face mask oxygen  Post-op Assessment: Report given to RN, Post -op Vital signs reviewed and stable, Patient moving all extremities and Patient moving all extremities X 4  Post vital signs: Reviewed and stable  Last Vitals:  Filed Vitals:   12/27/14 0626  BP: 140/71  Pulse: 71  Temp: 36.6 C  Resp: 18    Complications: No apparent anesthesia complications

## 2014-12-27 NOTE — Anesthesia Preprocedure Evaluation (Addendum)
Anesthesia Evaluation  Patient identified by MRN, date of birth, ID band Patient awake    Reviewed: Allergy & Precautions, H&P , NPO status , Patient's Chart, lab work & pertinent test results  Airway Mallampati: III  TM Distance: >3 FB Neck ROM: Full    Dental no notable dental hx. (+) Teeth Intact, Dental Advisory Given   Pulmonary neg pulmonary ROS,    Pulmonary exam normal breath sounds clear to auscultation       Cardiovascular + Valvular Problems/Murmurs AS  Rhythm:Regular Rate:Normal + Systolic murmurs    Neuro/Psych negative neurological ROS  negative psych ROS   GI/Hepatic Neg liver ROS, GERD  Medicated and Controlled,  Endo/Other  negative endocrine ROS  Renal/GU negative Renal ROS  negative genitourinary   Musculoskeletal   Abdominal   Peds  Hematology negative hematology ROS (+)   Anesthesia Other Findings   Reproductive/Obstetrics negative OB ROS                            Anesthesia Physical Anesthesia Plan  ASA: II  Anesthesia Plan: General   Post-op Pain Management:    Induction: Intravenous  Airway Management Planned: Oral ETT  Additional Equipment:   Intra-op Plan:   Post-operative Plan: Extubation in OR  Informed Consent: I have reviewed the patients History and Physical, chart, labs and discussed the procedure including the risks, benefits and alternatives for the proposed anesthesia with the patient or authorized representative who has indicated his/her understanding and acceptance.   Dental advisory given  Plan Discussed with: CRNA  Anesthesia Plan Comments:         Anesthesia Quick Evaluation

## 2014-12-27 NOTE — Progress Notes (Signed)
Orthopedic Tech Progress Note Patient Details:  Kirk Campbell September 08, 1960 [id number]  Ortho Devices Type of Ortho Device: Soft collar Ortho Device/Splint Interventions: Application   [name] 12/27/2014, 1:24 PM

## 2014-12-27 NOTE — Anesthesia Procedure Notes (Signed)
Procedure Name: Intubation Date/Time: 12/27/2014 7:44 AM Performed by: Izora Gala Pre-anesthesia Checklist: Patient identified, Emergency Drugs available, Suction available and Patient being monitored Patient Re-evaluated:Patient Re-evaluated prior to inductionOxygen Delivery Method: Circle system utilized Preoxygenation: Pre-oxygenation with 100% oxygen Intubation Type: IV induction Laryngoscope Size: Glidescope (Glidescope used after 1 attempt with Sabra Heck.  Class 3 view, overbite with small mouth.) Grade View: Grade III Tube type: Oral Tube size: 7.5 mm Number of attempts: 2 (1st attempt with Miller 3.  Easy intubation nwilth Glidescope) Airway Equipment and Method: Video-laryngoscopy Placement Confirmation: ETT inserted through vocal cords under direct vision,  positive ETCO2 and breath sounds checked- equal and bilateral Secured at: 21 cm Tube secured with: Tape Dental Injury: Teeth and Oropharynx as per pre-operative assessment

## 2014-12-27 NOTE — Anesthesia Postprocedure Evaluation (Signed)
Anesthesia Post Note  Patient: Kirk Campbell  Procedure(s) Performed: Procedure(s) (LRB): C5-6, C6-7 Anterior Cervical Discectomy and Fusion, Allograft, Plate (N/A)  Patient location during evaluation: PACU Anesthesia Type: General Level of consciousness: awake and alert Pain management: pain level controlled Vital Signs Assessment: post-procedure vital signs reviewed and stable Respiratory status: spontaneous breathing, nonlabored ventilation, respiratory function stable and patient connected to nasal cannula oxygen Cardiovascular status: blood pressure returned to baseline and stable Postop Assessment: no signs of nausea or vomiting Anesthetic complications: no    Last Vitals:  Filed Vitals:   12/27/14 1229 12/27/14 1241  BP: 127/96 128/69  Pulse: 97 96  Temp: 37.4 C 37.1 C  Resp: 14 18    Last Pain:  Filed Vitals:   12/27/14 1243  PainSc: 5                  Cylas Falzone,W. EDMOND

## 2014-12-27 NOTE — Brief Op Note (Signed)
12/27/2014  10:13 AM  PATIENT:  [name]  54 y.o. male  PRE-OPERATIVE DIAGNOSIS:  C5-6, C6-7 Spondylosis, Stenosis  POST-OPERATIVE DIAGNOSIS:  C5-6, C6-7 Spondylosis, Stenosis  PROCEDURE:  Procedure(s): C5-6, C6-7 Anterior Cervical Discectomy and Fusion, Allograft, Plate (N/A)  SURGEON:  Surgeon(s) and Role:    Marybelle Killings, MD - Primary  PHYSICIAN ASSISTANT: Bricia Taher m. Ricard Dillon PA-C    ANESTHESIA:   general  EBL:  Total I/O In: 1000 [I.V.:1000] Out: -   BLOOD ADMINISTERED:none  DRAINS: hemovac   LOCAL MEDICATIONS USED:  NONE  SPECIMEN:  No Specimen  DISPOSITION OF SPECIMEN:  N/A  COUNTS:  YES  TOURNIQUET:  * No tourniquets in log *  DICTATION: .Dragon Dictation  PLAN OF CARE: Admit for overnight observation  PATIENT DISPOSITION:  PACU - hemodynamically stable.

## 2014-12-28 DIAGNOSIS — M50223 Other cervical disc displacement at C6-C7 level: Secondary | ICD-10-CM | POA: Diagnosis not present

## 2014-12-28 NOTE — Progress Notes (Signed)
Orthopedic Tech Progress Note Patient Details:  Kirk Campbell [birth year] [id number]  Patient ID: Kirk Campbell, male   DOB: Feb 06, 1960, 54 y.o.   MRN: [id number] As ordered by Dr. Hamilton Capri, Melven Stockard 12/28/2014, 10:38 AM

## 2014-12-28 NOTE — Discharge Summary (Signed)
Physician Discharge Summary  Patient ID: Kirk Campbell MRN: [id number] DOB/AGE: [birth year] 55 y.o.  Admit date: 12/27/2014 Discharge date: 12/28/2014  Admission Diagnoses: Herniated cervical disc  Discharge Diagnoses:  Active Problems:   S/P cervical spinal fusion   Discharged Condition: stable  Hospital Course: Patient's hospital course was essentially unremarkable. He underwent anterior cervical discectomy and fusion was discharged to home.  Consults: None  Significant Diagnostic Studies: labs: Routine labs  Treatments: surgery: See operative note  Discharge Exam: Blood pressure 117/68, pulse 87, temperature 98.8 F (37.1 C), temperature source Oral, resp. rate 16, SpO2 93 %. Incision/Wound: dressing clean and dry  Disposition: Final discharge disposition not confirmed  Discharge Instructions    Call MD / Call 911    Complete by:  As directed   If you experience chest pain or shortness of breath, CALL 911 and be transported to the hospital emergency room.  If you develope a fever above 101 F, pus (white drainage) or increased drainage or redness at the wound, or calf pain, call your surgeon's office.     Call MD / Call 911    Complete by:  As directed   If you experience chest pain or shortness of breath, CALL 911 and be transported to the hospital emergency room.  If you develope a fever above 101 F, pus (white drainage) or increased drainage or redness at the wound, or calf pain, call your surgeon's office.     Constipation Prevention    Complete by:  As directed   Drink plenty of fluids.  Prune juice may be helpful.  You may use a stool softener, such as Colace (over the counter) 100 mg twice a day.  Use MiraLax (over the counter) for constipation as needed.     Constipation Prevention    Complete by:  As directed   Drink plenty of fluids.  Prune juice may be helpful.  You may use a stool softener, such as Colace (over the counter) 100 mg twice a day.  Use MiraLax  (over the counter) for constipation as needed.     Diet - low sodium heart healthy    Complete by:  As directed      Diet - low sodium heart healthy    Complete by:  As directed      Discharge instructions    Complete by:  As directed   Ok to shower 5 days postop.  Do not apply any creams or ointments to incision.  Do not remove steri-strips.  Can use 4x4 gauze and tape for dressing changes.  No aggressive activity. Cervical collar must be on at all times except when showering.  Do not bend or turn neck.     Driving restrictions    Complete by:  As directed   No driving     Increase activity slowly as tolerated    Complete by:  As directed      Increase activity slowly as tolerated    Complete by:  As directed      Lifting restrictions    Complete by:  As directed   No lifting            Medication List    STOP taking these medications        aspirin 81 MG tablet     doxylamine (Sleep) 25 MG tablet  Commonly known as:  UNISOM     MULTI VITAMIN MENS PO      TAKE these medications  cetirizine 10 MG tablet  Commonly known as:  ZYRTEC  Take 10 mg by mouth daily.     esomeprazole 40 MG capsule  Commonly known as:  NEXIUM  Take 40 mg by mouth daily.     methocarbamol 500 MG tablet  Commonly known as:  ROBAXIN  Take 1 tablet (500 mg total) by mouth every 6 (six) hours as needed for muscle spasms.     oxyCODONE-acetaminophen 7.5-325 MG tablet  Commonly known as:  PERCOCET  Take 1 tablet by mouth every 6 (six) hours as needed for severe pain.           Follow-up Information    Schedule an appointment as soon as possible for a visit with Marybelle Killings, MD.   Specialty:  Orthopedic Surgery   Why:  need return office visit one week postop   Contact information:   [place] [address] [phone]       Signed: Newt Minion 12/28/2014, 7:40 AM

## 2014-12-28 NOTE — Op Note (Signed)
NAME:  Kirk Campbell, Kirk Campbell NO.:  [id number]  MEDICAL RECORD NO.:  [id number]  LOCATION:  [id number]                        FACILITY:  [facility]  PHYSICIAN:  Bj Morlock C. Lorin Mercy, M.D.    DATE OF BIRTH:  10-27-1960  DATE OF PROCEDURE:  12/27/2014 DATE OF DISCHARGE:                              OPERATIVE REPORT   PREOPERATIVE DIAGNOSIS:  Cervical spondylosis with disk protrusion, C5-6 and C6-7.  POSTOPERATIVE DIAGNOSIS:  Cervical spondylosis with disk protrusion, C5- 6 and C6-7.  PROCEDURE:  C5-6, C6-7 anterior cervical diskectomy and fusion, allograft and plate.  SURGEON:  Mayelin Panos C. Lorin Mercy, M.D.  ASSISTANT:  Alyson Locket. Ricard Dillon, PA-C, medically necessary and present for the entire procedure.  ESTIMATED BLOOD LOSS:  Minimal.  IMPLANTS:  DePuy Skyline 34-mm plate, 14-mm screws x6.  LifeNet cortical cancellous allograft, 7 mm x2.  DRAINS:  One Hemovac neck.  DESCRIPTION OF PROCEDURE:  After induction of general anesthesia, standard prepping and draping, time-out procedure, head halter traction was applied without weight.  Arms were tucked to the side.  Yellow pads over the ulnar nerve.  DuraPrep.  Area was squared with towels, sterile skin marker at the appropriate level starting at the midline, extending to the left, Betadine, Steri-Drape, sterile Mayo stand at the head, thyroid sheets and drapes.  Incision was made at the midline extending to the left.  Platysma split in line with the fibers.  Gelpi retractor was placed.  Blunt dissection down between the carotid sheath and contents, esophagus and trachea towards the midline down to the level of longus coli.  Spurs were noted, palpated.  Short 25 needle placed at C6- 7 level, confirmed with lateral C-arm image after the C-arm was draped. C5-6 level was the most severe, so was surgically addressed first. Piece of disk was cut out the marked, then self-draining teeth retractors were placed right and left, smooth blades up and  down. Diskectomy was performed using Cloward curettes.  Operative microscope was draped and brought in.  Posterior longitudinal ligament was taken down.  Overhanging spurs, chunks of disk removed particularly on the right side where there was a large amount of disk that was protruded and some of it had extended through the posterior longitudinal ligament. Once the dura was completely decompressed, uncovertebral joints were stripped.  Trial sizers 6 and 7, showed 7 gave a nice fit.  Traction was pulled by the CRNA.  Graft was countersunk 2 mm, relaxed and identical procedure was repeated at the C6-7 level.  Anterior spur was removed with a Estill Cotta rongeur.  There was some bone bleeding on the left posteriorly at C6-7 level.  Surgicel was placed as well as thrombin and Gelfoam, which had already been opened and was on the table.  A 7-mm graft was placed after posterior longitudinal ligament had been completely taken down.  Dura was decompressed.  Less severe changes at C6-7 than the 5-6 level.  Overhanging spurs were removed.  Sizing showed a 7 gave good fit.  It was countersunk 2-3 mm.  Plate sizes were tried, 34 was selected and six screws were placed, checked under C-arm, AP and lateral and then all screws were locked in.  Hemovac was placed with an  in and out technique. Platysma was closed with 3-0 and 4-0 Vicryl subcuticular in the skin. Tincture of benzoin, Steri-Strips, 6 mL of Marcaine local in the skin incision.  Postop dressing, soft cervical collar and tape.  Instrument count and needle counts were correct.     Eppie Barhorst C. Lorin Mercy, M.D.     MCY/MEDQ  D:  12/27/2014  T:  12/28/2014  Job:  [id number]

## 2014-12-30 ENCOUNTER — Encounter (HOSPITAL_COMMUNITY): Payer: Self-pay | Admitting: Orthopaedic Surgery

## 2015-10-23 ENCOUNTER — Encounter (INDEPENDENT_AMBULATORY_CARE_PROVIDER_SITE_OTHER): Payer: Self-pay

## 2015-10-23 ENCOUNTER — Encounter: Payer: Self-pay | Admitting: Internal Medicine

## 2015-10-23 ENCOUNTER — Ambulatory Visit (INDEPENDENT_AMBULATORY_CARE_PROVIDER_SITE_OTHER): Payer: BLUE CROSS/BLUE SHIELD | Admitting: Internal Medicine

## 2015-10-23 VITALS — BP 132/80 | HR 68 | Ht 70.0 in | Wt 183.2 lb

## 2015-10-23 DIAGNOSIS — I77810 Thoracic aortic ectasia: Secondary | ICD-10-CM

## 2015-10-23 NOTE — Progress Notes (Signed)
Cardiology Office Note   Date:  10/23/2015   ID:  Kirk Campbell, DOB 1960-11-15, MRN [id number]  PCP:  Myrlene Broker, MD  Cardiologist:   Dorris Carnes, MD   F/U of aortic valve dz     History of Present Illness: Kirk Campbell is a 55 y.o. male with a history of AS/AI  MIld  I saw him in Oct 2016 Since seen had surgery on back  Better  Breathing is good  No CP   NO diziness    Outpatient Medications Prior to Visit  Medication Sig Dispense Refill  . cetirizine (ZYRTEC) 10 MG tablet Take 10 mg by mouth daily.    Marland Kitchen esomeprazole (NEXIUM) 40 MG capsule Take 40 mg by mouth daily.  3  . methocarbamol (ROBAXIN) 500 MG tablet Take 1 tablet (500 mg total) by mouth every 6 (six) hours as needed for muscle spasms. (Patient not taking: Reported on 10/23/2015) 60 tablet 0  . oxyCODONE-acetaminophen (PERCOCET) 7.5-325 MG tablet Take 1 tablet by mouth every 6 (six) hours as needed for severe pain. (Patient not taking: Reported on 10/23/2015) 60 tablet 0   No facility-administered medications prior to visit.      Allergies:   Review of patient's allergies indicates no known allergies.   Past Medical History:  Diagnosis Date  . Heart murmur   . Reflux   . Right inguinal hernia     Past Surgical History:  Procedure Laterality Date  . ANTERIOR CERVICAL DECOMP/DISCECTOMY FUSION N/A 12/27/2014   Procedure: C5-6, C6-7 Anterior Cervical Discectomy and Fusion, Allograft, Plate;  Surgeon: Marybelle Killings, MD;  Location: [facility];  Service: Orthopedics;  Laterality: N/A;  . NO PAST SURGERIES       Social History:  The patient  reports that he has never smoked. He has never used smokeless tobacco. He reports that he does not drink alcohol.   Family History:  The patient's family history is not on file.    ROS:  Please see the history of present illness. All other systems are reviewed and  Negative to the above problem except as noted.    PHYSICAL EXAM: VS:  BP 132/80   Pulse 68   Ht  5\' 10"  (1.778 m)   Wt 183 lb 3.2 oz (83.1 kg)   BMI 26.29 kg/m   GEN: Well nourished, well developed, in no acute distress  HEENT: normal  Neck: no JVD, carotid bruits, or masses Cardiac: RRR; Gr II/VI systolic murmur at base  I/VI diastolic murmur  , rubs, or gallops,no edema  Respiratory:  clear to auscultation bilaterally, normal work of breathing GI: soft, nontender, nondistended, + BS  No hepatomegaly  MS: no deformity Moving all extremities   Skin: warm and dry, no rash Neuro:  Strength and sensation are intact Psych: euthymic mood, full affect   EKG:  EKG is ordered today.  SR 68 bpm    Lipid Panel No results found for: CHOL, TRIG, HDL, CHOLHDL, VLDL, LDLCALC, LDLDIRECT    Wt Readings from Last 3 Encounters:  10/23/15 183 lb 3.2 oz (83.1 kg)  12/20/14 189 lb 6.4 oz (85.9 kg)  10/21/14 183 lb 1.9 oz (83.1 kg)      ASSESSMENT AND PLAN:  1  Aortic valve dz  Prob bicuspid  Will get echo this summer   2  Aortic diltation  WIll set up for CT to evla aorta.    F?U Summer 2019   Current medicines are reviewed at length with the  patient today.  The patient does not have concerns regarding medicines.  Signed, Dorris Carnes, MD  10/23/2015 9:32 AM    [address], [place], [place]  [address] Phone: [phone]; Fax: [phone]

## 2015-10-23 NOTE — Patient Instructions (Signed)
Your physician recommends that you continue on your current medications as directed. Please refer to the Current Medication list given to you today.  Non-Cardiac CT scanning, (CAT scanning), is a noninvasive, special x-ray that produces cross-sectional images of the body using x-rays and a computer. CT scans help physicians diagnose and treat medical conditions. For some CT exams, a contrast material is used to enhance visibility in the area of the body being studied. CT scans provide greater clarity and reveal more details than regular x-ray exams.  Your physician has requested that you have an echocardiogram. Echocardiography is a painless test that uses sound waves to create images of your heart. It provides your doctor with information about the size and shape of your heart and how well your heart's chambers and valves are working. This procedure takes approximately one hour. There are no restrictions for this procedure.  Your physician wants you to follow-up in: June 2019 with Dr. [name].  You will receive a reminder letter in the mail two months in advance. If you don't receive a letter, please call our office to schedule the follow-up appointment.

## 2015-10-27 NOTE — Addendum Note (Signed)
Addended by: Rodman Key on: 10/27/2015 04:07 PM   Modules accepted: Orders

## 2015-11-19 ENCOUNTER — Ambulatory Visit (INDEPENDENT_AMBULATORY_CARE_PROVIDER_SITE_OTHER)
Admission: RE | Admit: 2015-11-19 | Discharge: 2015-11-19 | Disposition: A | Discharge status: Home / Self Care | Payer: BLUE CROSS/BLUE SHIELD | Source: Ambulatory Visit | Attending: Internal Medicine | Admitting: Internal Medicine

## 2015-11-19 DIAGNOSIS — I77810 Thoracic aortic ectasia: Secondary | ICD-10-CM | POA: Diagnosis not present

## 2015-11-19 MED ORDER — IOPAMIDOL (ISOVUE-370) INJECTION 76%
100.0000 mL | Freq: Once | INTRAVENOUS | Status: AC | PRN
  Administered 2015-11-19: 100 mL via INTRAVENOUS

## 2016-02-13 DIAGNOSIS — J111 Influenza due to unidentified influenza virus with other respiratory manifestations: Secondary | ICD-10-CM | POA: Diagnosis not present

## 2016-03-25 DIAGNOSIS — L570 Actinic keratosis: Secondary | ICD-10-CM | POA: Diagnosis not present

## 2016-03-25 DIAGNOSIS — X32XXXD Exposure to sunlight, subsequent encounter: Secondary | ICD-10-CM | POA: Diagnosis not present

## 2016-06-25 ENCOUNTER — Other Ambulatory Visit: Payer: Self-pay

## 2016-06-25 ENCOUNTER — Ambulatory Visit (HOSPITAL_COMMUNITY): Payer: BLUE CROSS/BLUE SHIELD | Attending: Cardiovascular Disease

## 2016-06-25 DIAGNOSIS — I340 Nonrheumatic mitral (valve) insufficiency: Secondary | ICD-10-CM | POA: Diagnosis not present

## 2016-06-25 DIAGNOSIS — I77810 Thoracic aortic ectasia: Secondary | ICD-10-CM | POA: Diagnosis not present

## 2016-06-25 DIAGNOSIS — I352 Nonrheumatic aortic (valve) stenosis with insufficiency: Secondary | ICD-10-CM | POA: Diagnosis not present

## 2016-06-30 DIAGNOSIS — K4090 Unilateral inguinal hernia, without obstruction or gangrene, not specified as recurrent: Secondary | ICD-10-CM | POA: Diagnosis not present

## 2016-07-01 ENCOUNTER — Other Ambulatory Visit: Payer: Self-pay | Admitting: *Deleted

## 2016-07-01 DIAGNOSIS — I359 Nonrheumatic aortic valve disorder, unspecified: Secondary | ICD-10-CM

## 2016-08-26 DIAGNOSIS — K4090 Unilateral inguinal hernia, without obstruction or gangrene, not specified as recurrent: Secondary | ICD-10-CM | POA: Diagnosis not present

## 2016-09-10 ENCOUNTER — Encounter (INDEPENDENT_AMBULATORY_CARE_PROVIDER_SITE_OTHER): Payer: Self-pay

## 2016-09-10 ENCOUNTER — Encounter: Payer: Self-pay | Admitting: Internal Medicine

## 2016-09-10 ENCOUNTER — Ambulatory Visit (INDEPENDENT_AMBULATORY_CARE_PROVIDER_SITE_OTHER): Payer: BLUE CROSS/BLUE SHIELD | Admitting: Internal Medicine

## 2016-09-10 VITALS — BP 112/78 | HR 73 | Ht 70.0 in | Wt 178.2 lb

## 2016-09-10 DIAGNOSIS — I350 Nonrheumatic aortic (valve) stenosis: Secondary | ICD-10-CM | POA: Diagnosis not present

## 2016-09-10 NOTE — Progress Notes (Signed)
Cardiology Office Note   Date:  09/10/2016   ID:  Kirk Campbell, DOB 1960/12/07, MRN [id number]  PCP:  Myrlene Broker, MD  Cardiologist:   Dorris Carnes, MD   F/U of aortic valve dz     History of Present Illness: Kirk Campbell is a 56 y.o. male with a history of AS/AI  MIld  I saw him in Oct 2017 SInce seen he has done well  Active  No SOB  No CP Did have a syncopal spell recently  Occurred after  hernia surgery  Not drinking much to avoid urinating  ON pain meds at the time  Felt dizzy and then passed out.  No injury     Outpatient Medications Prior to Visit  Medication Sig Dispense Refill  . aspirin EC 81 MG tablet Take 81 mg by mouth daily.     No facility-administered medications prior to visit.      Allergies:   Patient has no known allergies.   Past Medical History:  Diagnosis Date  . Heart murmur   . Reflux   . Right inguinal hernia     Past Surgical History:  Procedure Laterality Date  . ANTERIOR CERVICAL DECOMP/DISCECTOMY FUSION N/A 12/27/2014   Procedure: C5-6, C6-7 Anterior Cervical Discectomy and Fusion, Allograft, Plate;  Surgeon: Marybelle Killings, MD;  Location: [facility];  Service: Orthopedics;  Laterality: N/A;  . NO PAST SURGERIES       Social History:  The patient  reports that he has never smoked. He has never used smokeless tobacco. He reports that he does not drink alcohol.   Family History:  The patient's family history is not on file.    ROS:  Please see the history of present illness. All other systems are reviewed and  Negative to the above problem except as noted.    PHYSICAL EXAM: VS:  BP 112/78   Pulse 73   Ht 5\' 10"  (1.778 m)   Wt 178 lb 3.2 oz (80.8 kg)   SpO2 97%   BMI 25.57 kg/m   GEN: Well nourished, well developed, in no acute distress  HEENT: normal  Neck: no JVD, carotid bruits, or masses Cardiac: RRR; Gr II/VI systolic murmur at base  I/VI diastolic murmur  , rubs, or gallops,no edema  Respiratory:  clear to  auscultation bilaterally, normal work of breathing GI: soft, nontender, nondistended, + BS  No hepatomegaly  MS: no deformity Moving all extremities   Skin: warm and dry, no rash Neuro:  Strength and sensation are intact Psych: euthymic mood, full affect   EKG:  EKG is not ordered today    Lipid Panel No results found for: CHOL, TRIG, HDL, CHOLHDL, VLDL, LDLCALC, LDLDIRECT    Wt Readings from Last 3 Encounters:  09/10/16 178 lb 3.2 oz (80.8 kg)  10/23/15 183 lb 3.2 oz (83.1 kg)  12/20/14 189 lb 6.4 oz (85.9 kg)      ASSESSMENT AND PLAN:  1  Aortic valve dz  Prob bicuspid  Repeat echo shows rel unchanged from 2016  Mean gradient 23  AI unchanged I would recomm following in 18 mon  No extreme lifting otherwise activity as tolerated  2  Aortic diltation  CT scan showed aorta normal, appeared smaller than on echo  REcent echo aorta was 40 mm  Follow    3  Lipids  Good   Blood work done at [facility]  LDL 97  HDL 51   F/U Winter 2020  Current medicines are reviewed at length with the patient today.  The patient does not have concerns regarding medicines.  Signed, Dorris Carnes, MD  09/10/2016 8:33 AM    [address], [place], [place]  [address] Phone: [phone]; Fax: [phone]

## 2016-09-10 NOTE — Patient Instructions (Signed)
Your physician recommends that you continue on your current medications as directed. Please refer to the Current Medication list given to you today.  Your physician has requested that you have an echocardiogram. Echocardiography is a painless test that uses sound waves to create images of your heart. It provides your doctor with information about the size and shape of your heart and how well your heart's chambers and valves are working. This procedure takes approximately one hour. There are no restrictions for this procedure.  Your physician wants you to follow-up in: February 2020 with Dr. [name] with echo prior to this appointment.   You will receive a reminder letter in the mail two months in advance. If you don't receive a letter, please call our office to schedule the follow-up appointment.

## 2017-04-23 DIAGNOSIS — J0100 Acute maxillary sinusitis, unspecified: Secondary | ICD-10-CM | POA: Diagnosis not present

## 2017-04-23 DIAGNOSIS — J302 Other seasonal allergic rhinitis: Secondary | ICD-10-CM | POA: Diagnosis not present

## 2017-10-24 ENCOUNTER — Other Ambulatory Visit: Payer: Self-pay | Admitting: Family Medicine

## 2017-10-24 DIAGNOSIS — R252 Cramp and spasm: Secondary | ICD-10-CM | POA: Diagnosis not present

## 2017-10-24 DIAGNOSIS — R0989 Other specified symptoms and signs involving the circulatory and respiratory systems: Secondary | ICD-10-CM

## 2017-10-28 ENCOUNTER — Ambulatory Visit
Admission: RE | Admit: 2017-10-28 | Discharge: 2017-10-28 | Disposition: A | Discharge status: Home / Self Care | Payer: BLUE CROSS/BLUE SHIELD | Source: Ambulatory Visit | Attending: Family Medicine | Admitting: Family Medicine

## 2017-10-28 DIAGNOSIS — R0989 Other specified symptoms and signs involving the circulatory and respiratory systems: Secondary | ICD-10-CM

## 2018-01-10 DIAGNOSIS — J324 Chronic pansinusitis: Secondary | ICD-10-CM | POA: Diagnosis not present

## 2018-03-03 ENCOUNTER — Ambulatory Visit (HOSPITAL_COMMUNITY): Payer: BLUE CROSS/BLUE SHIELD | Attending: Internal Medicine

## 2018-03-03 DIAGNOSIS — I350 Nonrheumatic aortic (valve) stenosis: Secondary | ICD-10-CM | POA: Diagnosis not present

## 2018-03-24 DIAGNOSIS — L570 Actinic keratosis: Secondary | ICD-10-CM | POA: Diagnosis not present

## 2018-03-24 DIAGNOSIS — B078 Other viral warts: Secondary | ICD-10-CM | POA: Diagnosis not present

## 2018-03-24 DIAGNOSIS — X32XXXD Exposure to sunlight, subsequent encounter: Secondary | ICD-10-CM | POA: Diagnosis not present

## 2018-05-18 DIAGNOSIS — X32XXXD Exposure to sunlight, subsequent encounter: Secondary | ICD-10-CM | POA: Diagnosis not present

## 2018-05-18 DIAGNOSIS — L570 Actinic keratosis: Secondary | ICD-10-CM | POA: Diagnosis not present

## 2018-05-18 DIAGNOSIS — C44329 Squamous cell carcinoma of skin of other parts of face: Secondary | ICD-10-CM | POA: Diagnosis not present

## 2018-05-18 DIAGNOSIS — B078 Other viral warts: Secondary | ICD-10-CM | POA: Diagnosis not present

## 2018-05-24 ENCOUNTER — Telehealth: Payer: Self-pay

## 2018-05-24 NOTE — Telephone Encounter (Signed)
Virtual Visit Pre-Appointment Phone Call  "(Name), I am calling you today to discuss your upcoming appointment. We are currently trying to limit exposure to the virus that causes COVID-19 by seeing patients at home rather than in the office."  1. "What is the BEST phone number to call the day of the visit?" - include this in appointment notes  2. "Do you have or have access to (through a family member/friend) a smartphone with video capability that we can use for your visit?" a. If yes - list this number in appt notes as "cell" (if different from BEST phone #) and list the appointment type as a VIDEO visit in appointment notes b. If no - list the appointment type as a PHONE visit in appointment notes  3. Confirm consent - "In the setting of the current Covid19 crisis, you are scheduled for a VIDEO visit with your provider on 5/7 at 830am.  Just as we do with many in-office visits, in order for you to participate in this visit, we must obtain consent.  If you'd like, I can send this to your mychart (if signed up) or email for you to review.  Otherwise, I can obtain your verbal consent now.  All virtual visits are billed to your insurance company just like a normal visit would be.  By agreeing to a virtual visit, we'd like you to understand that the technology does not allow for your provider to perform an examination, and thus may limit your provider's ability to fully assess your condition. If your provider identifies any concerns that need to be evaluated in person, we will make arrangements to do so.  Finally, though the technology is pretty good, we cannot assure that it will always work on either your or our end, and in the setting of a video visit, we may have to convert it to a phone-only visit.  In either situation, we cannot ensure that we have a secure connection.  Are you willing to proceed?" STAFF: Did the patient verbally acknowledge consent to telehealth visit? Document YES/NO here: YES   4. Advise patient to be prepared - "Two hours prior to your appointment, go ahead and check your blood pressure, pulse, oxygen saturation, and your weight (if you have the equipment to check those) and write them all down. When your visit starts, your provider will ask you for this information. If you have an Apple Watch or Kardia device, please plan to have heart rate information ready on the day of your appointment. Please have a pen and paper handy nearby the day of the visit as well."  5. Give patient instructions for MyChart download to smartphone OR [email or web address] as below if video visit (depending on what platform provider is using)  6. Inform patient they will receive a phone call 15 minutes prior to their appointment time (may be from unknown caller ID) so they should be prepared to answer    TELEPHONE CALL NOTE  Kirk Campbell has been deemed a candidate for a follow-up tele-health visit to limit community exposure during the Covid-19 pandemic. I spoke with the patient via phone to ensure availability of phone/video source, confirm preferred email & phone number, and discuss instructions and expectations.  I reminded Kirk Campbell to be prepared with any vital sign and/or heart rhythm information that could potentially be obtained via home monitoring, at the time of his visit. I reminded Kirk Campbell to expect a phone call prior to his visit.  [name] R  [name], RN 05/24/2018 3:31 PM

## 2018-05-25 ENCOUNTER — Telehealth (INDEPENDENT_AMBULATORY_CARE_PROVIDER_SITE_OTHER): Payer: BLUE CROSS/BLUE SHIELD | Admitting: Internal Medicine

## 2018-05-25 ENCOUNTER — Other Ambulatory Visit: Payer: Self-pay

## 2018-05-25 ENCOUNTER — Encounter: Payer: Self-pay | Admitting: Internal Medicine

## 2018-05-25 VITALS — Ht 70.0 in | Wt 175.0 lb

## 2018-05-25 DIAGNOSIS — I351 Nonrheumatic aortic (valve) insufficiency: Secondary | ICD-10-CM | POA: Diagnosis not present

## 2018-05-25 NOTE — Progress Notes (Signed)
Virtual Visit via Video Note   This visit type was conducted due to national recommendations for restrictions regarding the COVID-19 Pandemic (e.g. social distancing) in an effort to limit this patient's exposure and mitigate transmission in our community.  Due to his co-morbid illnesses, this patient is at least at moderate risk for complications without adequate follow up.  This format is felt to be most appropriate for this patient at this time.  All issues noted in this document were discussed and addressed.  A limited physical exam was performed with this format.  Please refer to the patient's chart for his consent to telehealth for [facility].   Date:  05/25/2018   ID:  Kirk Campbell, DOB 1960-02-17, MRN [id number]  Patient Location: Home Provider Location: Home  PCP:  Kirk Broker, MD  Cardiologist:  No primary care provider on file.  Electrophysiologist:  None   Evaluation Performed:  Follow-Up Visit  Chief Complaint:  F?U of AV disease    History of Present Illness:    Kirk Campbell is a 58 y.o. male with hx of aortic stenosis/aortic insuff    Mild   I saw him in 2018 Repeat echo in Feb LVEF normal 55 to 60%  Mean gradients through the AV was 36 mm Hg   AI was moderately severe  SInce I saw him in 2018 he has done OK   He denies CP   Breathing is OK   Occasional dizziness.    Walks some  Knows he should walk more   Carotid USN in Oct 2019 because of murmur in neck  No CV dz noted  The patient {does not have symptoms concerning for COVID-19 infection (fever, chills, cough, or new shortness of breath).    Past Medical History:  Diagnosis Date  . Heart murmur   . Reflux   . Right inguinal hernia    Past Surgical History:  Procedure Laterality Date  . ANTERIOR CERVICAL DECOMP/DISCECTOMY FUSION N/A 12/27/2014   Procedure: C5-6, C6-7 Anterior Cervical Discectomy and Fusion, Allograft, Plate;  Surgeon: Marybelle Killings, MD;  Location: [place];  Service: Orthopedics;   Laterality: N/A;  . NO PAST SURGERIES       Current Meds  Medication Sig  . aspirin EC 81 MG tablet Take 81 mg by mouth daily.  Marland Kitchen VITAMIN D PO Take 5 capsules by mouth daily. 5000 iu per capsule     Allergies:   Patient has no known allergies.   Social History   Tobacco Use  . Smoking status: Never Smoker  . Smokeless tobacco: Never Used  Substance Use Topics  . Alcohol use: No    Alcohol/week: 0.0 standard drinks  . Drug use: Not on file     Family Hx: The patient's family history is negative for Cancer, Heart attack, Diabetes, Hyperlipidemia, and Hypertension.  ROS:   Please see the history of present illness.    All other systems reviewed and are negative.   Prior CV studies:   The following studies were reviewed today:  IMPRESSIONS    1. The left ventricle has normal systolic function, with an ejection fraction of 55-60%. The cavity size was mildly dilated. Left ventricular diastolic Doppler parameters are consistent with pseudonormalization No evidence of left ventricular regional  wall motion abnormalities.  2. The right ventricle has normal systolic function. The cavity was normal. There is no increase in right ventricular wall thickness.  3. The mitral valve is normal in structure. Mild calcification of  the mitral valve leaflet. There is mild mitral annular calcification present. No evidence of mitral valve stenosis. Mild regurgitation.  4. The tricuspid valve is normal in structure.  5. The aortic valveis bicuspid Severe calcifcation of the aortic valve. Aortic valve regurgitation is probably severe. However, cannot demonstrate holodiastolic flow reversal in the descending thoracic aorta. There was moderate-severe aortic stenosis  with mean gradient 36 mmHg and AVA 1.0 cm^2.  6. The pulmonic valve was normal in structure.  7. There is mild dilatation of the ascending aorta measuring 38 mm.  8. No evidence of left ventricular regional wall motion  abnormalities.  9. Right atrial pressure is estimated at 3 mmHg. 10. No complete TR doppler jet so unable to estimate PA systolic pressure.  FINDINGS  Left Ventricle: The left ventricle has normal systolic function, with an ejection fraction of 55-60%. The cavity size was mildly dilated. There is no increase in left ventricular wall thickness. Left ventricular diastolic Doppler parameters are  consistent with pseudonormalization No evidence of left ventricular regional wall motion abnormalities.. Right Ventricle: The right ventricle has normal systolic function. The cavity was normal. There is no increase in right ventricular wall thickness. Left Atrium: left atrial size was normal in size Right Atrium: right atrial size was normal in size Right atrial pressure is estimated at 3 mmHg. Interatrial Septum: No atrial level shunt detected by color flow Doppler. Pericardium: There is no evidence of pericardial effusion. Mitral Valve: The mitral valve is normal in structure. Mild calcification of the mitral valve leaflet. There is mild mitral annular calcification present. Mitral valve regurgitation is mild by color flow Doppler. No evidence of mitral valve stenosis. Tricuspid Valve: The tricuspid valve is normal in structure. Tricuspid valve regurgitation was not visualized by color flow Doppler. Aortic Valve: The aortic valveis bicuspid Severe calcifcation of the aortic valve Aortic valve regurgitation is severe by color flow Doppler. Pulmonic Valve: The pulmonic valve was normal in structure. Pulmonic valve regurgitation is not visualized by color flow Doppler. Aorta: There is mild dilatation of the ascending aorta measuring 38 mm. Venous: The inferior vena cava is normal in size with greater than 50% respiratory variability.   LEFT VENTRICLE PLAX 2D (Teich) LV EF:          66.0 %   Diastology LVIDd:          5.70 cm  LV e' lateral:   9.36 cm/s LVIDs:          3.60 cm  LV E/e' lateral: 8.6 LV  PW:          0.80 cm  LV e' medial:    7.40 cm/s LV IVS:         0.60 cm  LV E/e' medial:  10.9 LVOT diam:      2.20 cm LV SV:          106 ml LVOT Area:      3.80 cm  RIGHT VENTRICLE RV Basal diam:  3.50 cm RV S prime:     18.30 cm/s TAPSE (M-mode): 2.4 cm  LEFT ATRIUM             Index       RIGHT ATRIUM           Index LA diam:        4.10 cm 2.06 cm/m  RA Pressure: 3 mmHg LA Vol (A2C):   61.7 ml 31.05 ml/m RA Area:     10.70 cm LA Vol (A4C):  30.5 ml 15.35 ml/m RA Volume:   23.20 ml  11.67 ml/m LA Biplane Vol: 46.1 ml 23.20 ml/m  AORTIC VALVE AV Area (Vmax):    1.07 cm AV Area (Vmean):   1.13 cm AV Area (VTI):     1.11 cm AV Vmax:           348.60 cm/s AV Vmean:          254.200 cm/s AV VTI:            0.772 m AV Peak Grad:      48.6 mmHg AV Mean Grad:      36.0 mmHg LVOT Vmax:         98.17 cm/s LVOT Vmean:        75.567 cm/s LVOT VTI:          0.226 m LVOT/AV VTI ratio: 0.29 AR PHT:            533 msec   AORTA Ao Root diam: 3.40 cm Ao Asc diam:  3.80 cm  MITRAL VALVE MV Area (PHT): MV PHT: MV Decel Time: 254 msec MV E velocity: 80.60 cm/s MV A velocity: 64.70 cm/s MV E/A ratio:  1.25    Loralie Champagne MD Electronically signed by Loralie Champagne MD Signature Date/Time: 03/03/2018/4:48:23 PM      Labs/Other Tests and Data Reviewed:    EKG not done as televisit  Recent Labs: No results found for requested labs within last 8760 hours.   Recent Lipid Panel No results found for: CHOL, TRIG, HDL, CHOLHDL, LDLCALC, LDLDIRECT  Wt Readings from Last 3 Encounters:  05/25/18 175 lb (79.4 kg)  09/10/16 178 lb 3.2 oz (80.8 kg)  10/23/15 183 lb 3.2 oz (83.1 kg)     Objective:    Vital Signs:  Ht 5\' 10"  (1.778 m)   Wt 175 lb (79.4 kg)   BMI 25.11 kg/m    Pt in NAD on phone    No exam done   ASSESSMENT & PLAN:    1. AV dz   I have reviewed echo   The valve is thickened, calcified   May be functionally bicuspid vs dysplastic.  There is  moderate AS and moderate AI   LVEDD is 36 mm  I would recomm repeat echo in Decmber    I will see him in clinic after   I do not think he is near replacement Actviities as tolerated   Occasional heavy lifting but not routing  2  DIzziness   Rare  Stay hydrated   Call if worsens  COVID-19 Education: The signs and symptoms of COVID-19 were discussed with the patient and how to seek care for testing (follow up with PCP or arrange E-visit).  The importance of social distancing was discussed today.  Time:   Today, I have spent 20  minutes with the patient with telehealth technology discussing the above problems.     Medication Adjustments/Labs and Tests Ordered: Current medicines are reviewed at length with the patient today.  Concerns regarding medicines are outlined above.   Tests Ordered: No orders of the defined types were placed in this encounter.   Medication Changes: No orders of the defined types were placed in this encounter.   Disposition:  Follow up December with echo to reevaluate AV    Signed, Dorris Carnes, MD  05/25/2018 8:36 AM    [place]

## 2018-05-25 NOTE — Patient Instructions (Signed)
Medication Instructions:  No changes If you need a refill on your cardiac medications before your next appointment, please call your pharmacy.   Lab work: None today If you have labs (blood work) drawn today and your tests are completely normal, you will receive your results only by: Marland Kitchen MyChart Message (if you have MyChart) OR . A paper copy in the mail If you have any lab test that is abnormal or we need to change your treatment, we will call you to review the results.  Testing/Procedures: Echocardiogram - to be done in December 2020.  This can be same day as your next visit with Dr. [name].    Follow-Up: At [facility], you and your health needs are our priority.  As part of our continuing mission to provide you with exceptional heart care, we have created designated Provider Care Teams.  These Care Teams include your primary Cardiologist (physician) and Advanced Practice Providers (APPs -  Physician Assistants and Nurse Practitioners) who all work together to provide you with the care you need, when you need it. You will need a follow up appointment in:  7 months.  Please call our office 2 months in advance to schedule this appointment.  You may see Dr. [name] or one of the following Advanced Practice Providers on your designated Care Team: Richardson Dopp, PA-C [place], [place] . Daune Perch, NP  Any Other Special Instructions Will Be Listed Below (If Applicable). Echo and  Follow up office visit with Dr. [name] in December

## 2018-06-09 ENCOUNTER — Ambulatory Visit: Payer: BLUE CROSS/BLUE SHIELD | Admitting: Internal Medicine

## 2018-06-16 DIAGNOSIS — Z85828 Personal history of other malignant neoplasm of skin: Secondary | ICD-10-CM | POA: Diagnosis not present

## 2018-06-16 DIAGNOSIS — B078 Other viral warts: Secondary | ICD-10-CM | POA: Diagnosis not present

## 2018-06-16 DIAGNOSIS — Z08 Encounter for follow-up examination after completed treatment for malignant neoplasm: Secondary | ICD-10-CM | POA: Diagnosis not present

## 2018-06-16 DIAGNOSIS — X32XXXD Exposure to sunlight, subsequent encounter: Secondary | ICD-10-CM | POA: Diagnosis not present

## 2018-06-16 DIAGNOSIS — L570 Actinic keratosis: Secondary | ICD-10-CM | POA: Diagnosis not present

## 2018-08-03 DIAGNOSIS — L820 Inflamed seborrheic keratosis: Secondary | ICD-10-CM | POA: Diagnosis not present

## 2018-12-19 DIAGNOSIS — Z20828 Contact with and (suspected) exposure to other viral communicable diseases: Secondary | ICD-10-CM | POA: Diagnosis not present

## 2018-12-29 DIAGNOSIS — L820 Inflamed seborrheic keratosis: Secondary | ICD-10-CM | POA: Diagnosis not present

## 2018-12-29 DIAGNOSIS — Z85828 Personal history of other malignant neoplasm of skin: Secondary | ICD-10-CM | POA: Diagnosis not present

## 2018-12-29 DIAGNOSIS — X32XXXD Exposure to sunlight, subsequent encounter: Secondary | ICD-10-CM | POA: Diagnosis not present

## 2018-12-29 DIAGNOSIS — Z08 Encounter for follow-up examination after completed treatment for malignant neoplasm: Secondary | ICD-10-CM | POA: Diagnosis not present

## 2018-12-29 DIAGNOSIS — L570 Actinic keratosis: Secondary | ICD-10-CM | POA: Diagnosis not present

## 2019-01-04 DIAGNOSIS — N486 Induration penis plastica: Secondary | ICD-10-CM | POA: Diagnosis not present

## 2019-02-05 ENCOUNTER — Ambulatory Visit: Payer: BLUE CROSS/BLUE SHIELD | Admitting: Internal Medicine

## 2019-02-05 ENCOUNTER — Other Ambulatory Visit (HOSPITAL_COMMUNITY): Payer: BLUE CROSS/BLUE SHIELD

## 2019-02-20 DIAGNOSIS — N486 Induration penis plastica: Secondary | ICD-10-CM | POA: Diagnosis not present

## 2019-02-21 DIAGNOSIS — N486 Induration penis plastica: Secondary | ICD-10-CM | POA: Diagnosis not present

## 2019-02-23 ENCOUNTER — Telehealth: Payer: Self-pay | Admitting: Internal Medicine

## 2019-02-23 ENCOUNTER — Encounter: Payer: Self-pay | Admitting: Internal Medicine

## 2019-02-23 ENCOUNTER — Ambulatory Visit: Payer: BLUE CROSS/BLUE SHIELD | Admitting: Internal Medicine

## 2019-02-23 ENCOUNTER — Ambulatory Visit (HOSPITAL_COMMUNITY): Payer: BC Managed Care – PPO | Attending: Cardiovascular Disease

## 2019-02-23 ENCOUNTER — Other Ambulatory Visit: Payer: Self-pay

## 2019-02-23 VITALS — BP 136/78 | HR 65 | Ht 70.0 in | Wt 184.4 lb

## 2019-02-23 DIAGNOSIS — I351 Nonrheumatic aortic (valve) insufficiency: Secondary | ICD-10-CM | POA: Diagnosis not present

## 2019-02-23 DIAGNOSIS — I493 Ventricular premature depolarization: Secondary | ICD-10-CM

## 2019-02-23 NOTE — Telephone Encounter (Signed)
Reviewed with EP   Would recomm a holter monitor to reeval PVC burden

## 2019-02-23 NOTE — Progress Notes (Signed)
Cardiology Office Note   Date:  02/23/2019   ID:  Kirk Campbell, DOB 1960/07/30, MRN [id number]  PCP:  Myrlene Broker, MD  Cardiologist:   Dorris Carnes, MD   History of Present Illness: Kirk Campbell is a 59 y.o. male with a history of aortic stenosis/aortic insuff      I saw him in 2018 Echo in Feb LVEF normal 55 to 60%  Mean gradients through the AV was 36 mm Hg   AI was moderately severe Carotid USN (due to murmur) in 2019 showed no CV dz     I saw him in clinic in 2018 then as a televisti in May 2020    The pt says he is doing OK   He is active   Has grandchildren   Says he can't do what he did when he was younger but is actie  The pt says a couple times he has woken up with chest tightness   Some SOB   Last episode 1 wk ago   Denies reflux  Not sure when he had another spell   No CP with activity  No edema   No dizziness      Current Meds  Medication Sig  . aspirin EC 81 MG tablet Take 81 mg by mouth daily.  Marland Kitchen VITAMIN D PO Take 5 capsules by mouth daily. 5000 iu per capsule  . VITAMIN E PO Take 1 capsule by mouth 2 (two) times daily.  Marland Kitchen XIAFLEX 0.9 MG SOLR Inject as directed every 6 (six) weeks.      Allergies:   Patient has no known allergies.   Past Medical History:  Diagnosis Date  . Heart murmur   . Reflux   . Right inguinal hernia     Past Surgical History:  Procedure Laterality Date  . ANTERIOR CERVICAL DECOMP/DISCECTOMY FUSION N/A 12/27/2014   Procedure: C5-6, C6-7 Anterior Cervical Discectomy and Fusion, Allograft, Plate;  Surgeon: Marybelle Killings, MD;  Location: [place];  Service: Orthopedics;  Laterality: N/A;  . NO PAST SURGERIES       Social History:  The patient  reports that he has never smoked. He has never used smokeless tobacco. He reports that he does not drink alcohol.   Family History:  The patient's family history is not on file.    ROS:  Please see the history of present illness. All other systems are reviewed and  Negative to the  above problem except as noted.    PHYSICAL EXAM: VS:  BP 136/78   Pulse 65   Ht 5\' 10"  (1.778 m)   Wt 184 lb 6.4 oz (83.6 kg)   BMI 26.46 kg/m   GEN: Well nourished, well developed, in no acute distress  HEENT: normal  Neck: no JVD Cardiac: RRR;  III/VI systolic murmurs at base   No diastolic murmurs   NoLE edema  Respiratory:  clear to auscultation bilaterally, normal work of breathing GI: soft, nontender, nondistended, + BS  No hepatomegaly  MS: no deformity Moving all extremities   Skin: warm and dry, no rash Neuro:  Strength and sensation are intact Psych: euthymic mood, full affect   EKG:  EKG is ordered today.    SR 65 bpm   LVH     Lipid Panel No results found for: CHOL, TRIG, HDL, CHOLHDL, VLDL, LDLCALC, LDLDIRECT    Wt Readings from Last 3 Encounters:  02/23/19 184 lb 6.4 oz (83.6 kg)  05/25/18 175 lb (79.4 kg)  09/10/16 178 lb 3.2 oz (80.8 kg)      ASSESSMENT AND PLAN:  1  AV dz   I have reviewed echo that he had done today  LVEF is normal  LV size unchanged.   AS is moderate with mean gradient 33 mm Hg  This is unchanged    AI remains mild      He has had a spell of ? PND   But, erratic   I am not convinced releated to valve   He is active  No CP   No symptoms of CHF   Follow clinically   Avoid dehydration     I will see him in Aug 2021        Current medicines are reviewed at length with the patient today.  The patient does not have concerns regarding medicines.  Signed, Dorris Carnes, MD  02/23/2019 2:55 PM    [facility] [place], [place], [place]  [address] Phone: [phone]; Fax: [phone]

## 2019-02-23 NOTE — Patient Instructions (Signed)
Medication Instructions:  Your physician recommends that you continue on your current medications as directed. Please refer to the Current Medication list given to you today.  *If you need a refill on your cardiac medications before your next appointment, please call your pharmacy*  Lab Work: None If you have labs (blood work) drawn today and your tests are completely normal, you will receive your results only by: Marland Kitchen MyChart Message (if you have MyChart) OR . A paper copy in the mail If you have any lab test that is abnormal or we need to change your treatment, we will call you to review the results.  Testing/Procedures: None  Follow-Up: At [facility], you and your health needs are our priority.  As part of our continuing mission to provide you with exceptional heart care, we have created designated Provider Care Teams.  These Care Teams include your primary Cardiologist (physician) and Advanced Practice Providers (APPs -  Physician Assistants and Nurse Practitioners) who all work together to provide you with the care you need, when you need it.  Your next appointment:   6 month(s)  The format for your next appointment:   In Person  Provider:   You may see Dorris Carnes, MD or one of the following Advanced Practice Providers on your designated Care Team:    Richardson Dopp, PA-C  [place], [place]  Daune Perch, NP   Other Instructions

## 2019-02-26 NOTE — Telephone Encounter (Signed)
Would recomm fro 3 days

## 2019-02-26 NOTE — Telephone Encounter (Signed)
Follow up  Patient states that he is returning your call. Please call.

## 2019-02-26 NOTE — Telephone Encounter (Signed)
Will clarify with Dr. [name] for how many days the patient should wear monitor 3-14.

## 2019-02-27 ENCOUNTER — Encounter: Payer: Self-pay | Admitting: *Deleted

## 2019-02-27 NOTE — Progress Notes (Signed)
Patient ID: Kirk Campbell, male   DOB: September 12, 1960, 59 y.o.   MRN: [id number] Patient enrolled for [employer] to mail a 3 day ZIO XT long term holter monitor to his home.  Instructions are included in the monitor kit.

## 2019-02-27 NOTE — Addendum Note (Signed)
Addended by: Rodman Key on: 02/27/2019 11:39 AM   Modules accepted: Orders

## 2019-02-27 NOTE — Telephone Encounter (Signed)
Spoke with patient and informed. Confirmed address. Will wear for 3 days and mail back.

## 2019-03-09 ENCOUNTER — Ambulatory Visit (INDEPENDENT_AMBULATORY_CARE_PROVIDER_SITE_OTHER): Payer: BC Managed Care – PPO

## 2019-03-09 DIAGNOSIS — I493 Ventricular premature depolarization: Secondary | ICD-10-CM | POA: Diagnosis not present

## 2019-04-02 DIAGNOSIS — N486 Induration penis plastica: Secondary | ICD-10-CM | POA: Diagnosis not present

## 2019-04-04 DIAGNOSIS — N486 Induration penis plastica: Secondary | ICD-10-CM | POA: Diagnosis not present

## 2019-05-14 DIAGNOSIS — N486 Induration penis plastica: Secondary | ICD-10-CM | POA: Diagnosis not present

## 2019-05-16 DIAGNOSIS — N486 Induration penis plastica: Secondary | ICD-10-CM | POA: Diagnosis not present

## 2019-06-25 DIAGNOSIS — N486 Induration penis plastica: Secondary | ICD-10-CM | POA: Diagnosis not present

## 2019-06-27 DIAGNOSIS — N486 Induration penis plastica: Secondary | ICD-10-CM | POA: Diagnosis not present

## 2019-07-13 DIAGNOSIS — Z08 Encounter for follow-up examination after completed treatment for malignant neoplasm: Secondary | ICD-10-CM | POA: Diagnosis not present

## 2019-07-13 DIAGNOSIS — Z85828 Personal history of other malignant neoplasm of skin: Secondary | ICD-10-CM | POA: Diagnosis not present

## 2019-07-13 DIAGNOSIS — L570 Actinic keratosis: Secondary | ICD-10-CM | POA: Diagnosis not present

## 2019-07-13 DIAGNOSIS — X32XXXD Exposure to sunlight, subsequent encounter: Secondary | ICD-10-CM | POA: Diagnosis not present

## 2019-09-20 DIAGNOSIS — N486 Induration penis plastica: Secondary | ICD-10-CM | POA: Diagnosis not present

## 2019-10-18 DIAGNOSIS — N486 Induration penis plastica: Secondary | ICD-10-CM | POA: Diagnosis not present

## 2019-10-19 DIAGNOSIS — N486 Induration penis plastica: Secondary | ICD-10-CM | POA: Diagnosis not present

## 2019-11-19 ENCOUNTER — Other Ambulatory Visit: Payer: Self-pay

## 2019-11-19 ENCOUNTER — Encounter: Payer: Self-pay | Admitting: Internal Medicine

## 2019-11-19 ENCOUNTER — Ambulatory Visit: Payer: BC Managed Care – PPO | Admitting: Internal Medicine

## 2019-11-19 VITALS — BP 122/58 | HR 73 | Ht 70.0 in | Wt 187.2 lb

## 2019-11-19 DIAGNOSIS — I351 Nonrheumatic aortic (valve) insufficiency: Secondary | ICD-10-CM

## 2019-11-19 NOTE — Patient Instructions (Signed)
Medication Instructions:  No changes today *If you need a refill on your cardiac medications before your next appointment, please call your pharmacy*   Lab Work: none If you have labs (blood work) drawn today and your tests are completely normal, you will receive your results only by: Marland Kitchen MyChart Message (if you have MyChart) OR . A paper copy in the mail If you have any lab test that is abnormal or we need to change your treatment, we will call you to review the results.   Testing/Procedures:  DUE END OF December 2021 Your physician has requested that you have an echocardiogram. Echocardiography is a painless test that uses sound waves to create images of your heart. It provides your doctor with information about the size and shape of your heart and how well your heart's chambers and valves are working. This procedure takes approximately one hour. There are no restrictions for this procedure.   Follow-Up: Based on results of echo.  Other Instructions

## 2019-11-19 NOTE — Progress Notes (Signed)
Cardiology Office Note   Date:  11/19/2019   ID:  Kirk Campbell, DOB [birth year], MRN [id number]  PCP:  Myrlene Broker, MD  Cardiologist:   Dorris Carnes, MD   Pt presents for f/u of AV dz  History of Present Illness: Kirk Campbell is a 59 y.o. male with a history of aortic stenosis/aortic insuff      I saw the pt in Feb 2021  Echo showed LVEF and RVEF normal   AV with mean gradient of 33 mm Hg (unchanged from previous echo)   Mild AI   The pt went on to have a monitor after which showed SR, rates controlled    The pt has done OK from cardiac standpont  No CP   One dizzy spell when going from bending to standing on hot day   Breathing is OK  He remains active     Current Meds  Medication Sig  . aspirin EC 81 MG tablet Take 81 mg by mouth daily.  Marland Kitchen VITAMIN D PO Take 5 capsules by mouth daily. 5000 iu per capsule  . VITAMIN E PO Take 1 capsule by mouth 2 (two) times daily.  Marland Kitchen XIAFLEX 0.9 MG SOLR Inject as directed every 6 (six) weeks.      Allergies:   Patient has no known allergies.   Past Medical History:  Diagnosis Date  . Heart murmur   . Reflux   . Right inguinal hernia     Past Surgical History:  Procedure Laterality Date  . ANTERIOR CERVICAL DECOMP/DISCECTOMY FUSION N/A 12/27/2014   Procedure: C5-6, C6-7 Anterior Cervical Discectomy and Fusion, Allograft, Plate;  Surgeon: Marybelle Killings, MD;  Location: [place];  Service: Orthopedics;  Laterality: N/A;  . NO PAST SURGERIES       Social History:  The patient  reports that he has never smoked. He has never used smokeless tobacco. He reports that he does not drink alcohol.   Family History:  The patient's family history is not on file.    ROS:  Please see the history of present illness. All other systems are reviewed and  Negative to the above problem except as noted.    PHYSICAL EXAM: VS:  BP (!) 122/58   Pulse 73   Ht 5\' 10"  (1.778 m)   Wt 187 lb 3.2 oz (84.9 kg)   SpO2 98%   BMI 26.86 kg/m   GEN:  Well nourished, well developed, in no acute distress  HEENT: normal  Neck: no JVD Cardiac: RRR;  III/VI systolic murmurs at base  Gr I-II/VI diastolic murmur   NoLE edema  Respiratory:  clear to auscultation bilaterally GI: soft, nontender, nondistended, + BS  No hepatomegaly  MS: no deformity Moving all extremities   Skin: warm and dry, no rash Neuro:  Strength and sensation are intact Psych: euthymic mood, full affect   EKG:  EKG is not orderred     Echo   Feb 2021  1. Left ventricular ejection fraction, by visual estimation, is 60 to  65%. The left ventricle has normal function. There is no left ventricular  hypertrophy.  2. Elevated left atrial and left ventricular end-diastolic pressures.  3. Left ventricular diastolic parameters are consistent with Grade II  diastolic dysfunction (pseudonormalization).  4. Mildly dilated left ventricular internal cavity size.  5. The left ventricle has no regional wall motion abnormalities.  6. Global right ventricle has normal systolic function.The right  ventricular size is normal. No increase in right  ventricular wall  thickness.  7. Left atrial size was normal.  8. Right atrial size was normal.  9. The mitral valve is normal in structure. Mild mitral valve  regurgitation. No evidence of mitral stenosis.  10. The tricuspid valve is normal in structure.  11. The tricuspid valve is normal in structure. Tricuspid valve  regurgitation is mild.  12. Aortic regurgitation PHT measures 494 msec.  13. Aortic valve mean gradient measures 33.0 mmHg.  14. Aortic valve peak gradient measures 60.8 mmHg.  15. Aortic valve regurgitation is mild to moderate.  16. The aortic valve is normal in structure. Aortic valve regurgitation is  mild to moderate. Moderate aortic valve stenosis.  17. The pulmonic valve was normal in structure. Pulmonic valve  regurgitation is not visualized.  18. Aortic dilatation noted.  19. There is mild dilatation of  the ascending aorta measuring 38 mm.  20. The inferior vena cava is normal in size with greater than 50%  respiratory variability, suggesting right atrial pressure of 3 mmHg. Lipid Panel No results found for: CHOL, TRIG, HDL, CHOLHDL, VLDL, LDLCALC, LDLDIRECT    Wt Readings from Last 3 Encounters:  11/19/19 187 lb 3.2 oz (84.9 kg)  02/23/19 184 lb 6.4 oz (83.6 kg)  05/25/18 175 lb (79.4 kg)      ASSESSMENT AND PLAN:  1  AV dz  I would recomm f/u echo at end of year  To reevaluate LVEF, chamber sizes, AV  Pt remains asymptomatic  Avoid dehydration   2  HCM   Labs at PCP showed LDL 120, HDL 46  Follow  Watch fats       Current medicines are reviewed at length with the patient today.  The patient does not have concerns regarding medicines.  Signed, Dorris Carnes, MD  11/19/2019 4:03 PM    [facility] [place], [place], [place]  [address] Phone: [phone]; Fax: [phone]

## 2019-12-24 DIAGNOSIS — N486 Induration penis plastica: Secondary | ICD-10-CM | POA: Diagnosis not present

## 2019-12-27 DIAGNOSIS — N486 Induration penis plastica: Secondary | ICD-10-CM | POA: Diagnosis not present

## 2020-01-09 ENCOUNTER — Ambulatory Visit (HOSPITAL_COMMUNITY): Payer: BC Managed Care – PPO | Attending: Cardiology

## 2020-01-09 ENCOUNTER — Other Ambulatory Visit: Payer: Self-pay

## 2020-01-09 DIAGNOSIS — I351 Nonrheumatic aortic (valve) insufficiency: Secondary | ICD-10-CM | POA: Diagnosis not present

## 2020-01-09 LAB — ECHOCARDIOGRAM COMPLETE
AR max vel: 1.11 cm2
AV Area VTI: 1.12 cm2
AV Area mean vel: 1.04 cm2
AV Mean grad: 38 mmHg
AV Peak grad: 64.3 mmHg
Ao pk vel: 4.01 m/s
Area-P 1/2: 4.8 cm2
P 1/2 time: 286 msec
S' Lateral: 3.7 cm

## 2020-01-17 ENCOUNTER — Other Ambulatory Visit (HOSPITAL_COMMUNITY): Payer: BC Managed Care – PPO

## 2020-02-04 ENCOUNTER — Other Ambulatory Visit: Payer: Self-pay | Admitting: *Deleted

## 2020-02-04 DIAGNOSIS — I351 Nonrheumatic aortic (valve) insufficiency: Secondary | ICD-10-CM

## 2020-03-29 ENCOUNTER — Encounter: Payer: Self-pay | Admitting: Internal Medicine

## 2020-03-29 DIAGNOSIS — I350 Nonrheumatic aortic (valve) stenosis: Secondary | ICD-10-CM | POA: Insufficient documentation

## 2020-04-15 ENCOUNTER — Other Ambulatory Visit: Payer: Self-pay

## 2020-04-15 ENCOUNTER — Ambulatory Visit (HOSPITAL_COMMUNITY): Payer: 59 | Attending: Internal Medicine

## 2020-04-15 DIAGNOSIS — I351 Nonrheumatic aortic (valve) insufficiency: Secondary | ICD-10-CM | POA: Diagnosis not present

## 2020-04-15 LAB — ECHOCARDIOGRAM COMPLETE
AR max vel: 1.06 cm2
AV Area VTI: 1.2 cm2
AV Area mean vel: 1.06 cm2
AV Mean grad: 38 mmHg
AV Peak grad: 65 mmHg
Ao pk vel: 4.03 m/s
Area-P 1/2: 3.99 cm2
P 1/2 time: 200 msec
S' Lateral: 3.9 cm

## 2020-04-23 NOTE — Progress Notes (Signed)
Will arrange follow up with Dr. [name] for 4-6 weeks.

## 2020-04-29 NOTE — Progress Notes (Signed)
Cardiology Office Note   Date:  04/30/2020   ID:  Kirk Campbell, DOB 07-01-1960, MRN [id number]  PCP:  Myrlene Broker, MD  Cardiologist:   Dorris Carnes, MD   Pt presents for f/u of AV dz  History of Present Illness: Kirk Campbell is a 60 y.o. male with a history of aortic stenosis/aortic insuff      I saw the pt in Feb 2021  Echo showed LVEF and RVEF normal   AV with mean gradient of 33 mm Hg (unchanged from previous echo)   Mild AI   The pt went on to have a monitor after which showed SR, rates controlled    I saw the pt again in Novembe 2021  He was doing OK at time   Had a necho in Dec 2021  Mean gradient was 52 mm Hg    I reviewed images  Only one gradient that high   Felt to be more mod with mod AI     The pt just had a repeat echo on 3/29    The pt denies CP   Breathing is OK   Notes some dizziness when stands up from bending  Denies PND The pt's wife is also here today   Says her husband has had decreased energy with activities  Pt has slowed down some compared to a few years ago     Current Meds  Medication Sig  . aspirin EC 81 MG tablet Take 81 mg by mouth daily.  Marland Kitchen VITAMIN D PO Take 5 capsules by mouth daily. 5000 iu per capsule  . VITAMIN E PO Take 1 capsule by mouth 2 (two) times daily.     Allergies:   Patient has no known allergies.   Past Medical History:  Diagnosis Date  . Heart murmur   . Reflux   . Right inguinal hernia     Past Surgical History:  Procedure Laterality Date  . ANTERIOR CERVICAL DECOMP/DISCECTOMY FUSION N/A 12/27/2014   Procedure: C5-6, C6-7 Anterior Cervical Discectomy and Fusion, Allograft, Plate;  Surgeon: Marybelle Killings, MD;  Location: [facility];  Service: Orthopedics;  Laterality: N/A;  . NO PAST SURGERIES       Social History:  The patient  reports that he has never smoked. He has never used smokeless tobacco. He reports that he does not drink alcohol.   Family History:  The patient's family history is not on file.     ROS:  Please see the history of present illness. All other systems are reviewed and  Negative to the above problem except as noted.    PHYSICAL EXAM: VS:  BP 114/72   Pulse 63   Ht 5\' 10"  (1.778 m)   Wt 188 lb (85.3 kg)   SpO2 99%   BMI 26.98 kg/m   GEN: Well nourished, well developed, in no acute distress  HEENT: normal  Neck: no JVD Cardiac: RRR;  III/VI systolic murmurs at base  Gr I-/VI diastolic murmur   NoLE edema   Brisk pulses   Respiratory:  clear to auscultation bilaterally GI: soft, nontender, nondistended, + BS  No hepatomegaly  MS: no deformity Moving all extremities   Skin: warm and dry, no rash Neuro:  Strength and sensation are intact Psych: euthymic mood, full affect   EKG:  EKG is orderred    SR 63 bpm     Latest echo:   04/15/20  1. Severe aortic valve regurgitation with moderate-severe aortic valve stenosis. Unable  to reproduce mean gradient from prior study. There are incompletely visualized but likely holodiastolic flow reversals in the descending aorta. The aortic valve is abnormal. Unable to determine aortic valve morphology due to calcifications. There is severe calcifcation of the aortic valve. Aortic valve regurgitation is severe. Moderate to severe aortic valve stenosis. Aortic regurgitation PHT measures 200 msec. Aortic valve mean gradient measures 38.0 mmHg. Aortic valve Vmax measures 4.03 m/s. 2. Left ventricular ejection fraction by 3D volume is 58 %. The left ventricle has normal function. The left ventricle has no regional wall motion abnormalities. The left ventricular internal cavity size was mildly dilated by indexed 3D volume. Left ventricular diastolic parameters are indeterminate. The average left ventricular global longitudinal strain is -21.1 %. The global longitudinal strain is normal. 3. Right ventricular systolic function is normal. The right ventricular size is normal. Tricuspid regurgitation signal is inadequate for assessing  PA pressure. 4. The mitral valve is normal in structure. Mild mitral valve regurgitation. No evidence of mitral stenosis. 5. The inferior vena cava is normal in size with greater than 50% respiratory variability, suggesting right atrial pressure of 3 mmHg.  Lipid Panel No results found for: CHOL, TRIG, HDL, CHOLHDL, VLDL, LDLCALC, LDLDIRECT    Wt Readings from Last 3 Encounters:  04/30/20 188 lb (85.3 kg)  11/19/19 187 lb 3.2 oz (84.9 kg)  02/23/19 184 lb 6.4 oz (83.6 kg)      ASSESSMENT AND PLAN:  1  AV dz  I have reviewed images with pt and wife   AV is probably bicuspid   It appears at  moderate to severly stenotic  The  AI is eccentric and there is diastolc backflow from descending aorta consistent with severe AI  The pt notes some fatiguability compared to a few yeass ago   May be from the valve    I would recomm R and L heart cath to define coronary anatomy, r/o CAD   Alos to eval hemodynamics further    Will arrange with pt   Risks and benefits described   Pt understands and agrees to proceed.    Avoid dehydration   2  HCM   Labs at PCP showed LDL 120, HDL 46  Follow  Watch fats for now         Current medicines are reviewed at length with the patient today.  The patient does not have concerns regarding medicines.  Signed, Dorris Carnes, MD  04/30/2020 11:07 PM    [address], [place], [place]  [address] Phone: [phone]; Fax: [phone]

## 2020-04-30 ENCOUNTER — Ambulatory Visit: Payer: 59 | Admitting: Internal Medicine

## 2020-04-30 ENCOUNTER — Other Ambulatory Visit: Payer: Self-pay

## 2020-04-30 ENCOUNTER — Encounter: Payer: Self-pay | Admitting: Internal Medicine

## 2020-04-30 VITALS — BP 114/72 | HR 63 | Ht 70.0 in | Wt 188.0 lb

## 2020-04-30 DIAGNOSIS — I351 Nonrheumatic aortic (valve) insufficiency: Secondary | ICD-10-CM | POA: Diagnosis not present

## 2020-04-30 NOTE — Patient Instructions (Signed)
Medication Instructions:  No changes *If you need a refill on your cardiac medications before your next appointment, please call your pharmacy*   Lab Work: none   Testing/Procedures: Your physician has requested that you have a cardiac catheterization. Cardiac catheterization is used to diagnose and/or treat various heart conditions. Doctors may recommend this procedure for a number of different reasons. The most common reason is to evaluate chest pain. Chest pain can be a symptom of coronary artery disease (CAD), and cardiac catheterization can show whether plaque is narrowing or blocking your heart's arteries. This procedure is also used to evaluate the valves, as well as measure the blood flow and oxygen levels in different parts of your heart. For further information please visit HugeFiesta.tn. Please follow instruction sheet, as given.   Follow-Up: Follow up with your physician will depend on test results.   Other Instructions Available dates for potential heart cath: May 9, 11, 12, 16, 25, 27

## 2020-05-22 ENCOUNTER — Telehealth: Payer: Self-pay | Admitting: Internal Medicine

## 2020-05-22 NOTE — Telephone Encounter (Signed)
LMTCB, no encounters or notes related to call from this office.

## 2020-05-22 NOTE — Telephone Encounter (Signed)
Follow Up:     Pt is returning a call from yesterday. He did not know who it was, he said he deleted the message by mistake.

## 2020-05-29 NOTE — Telephone Encounter (Signed)
Spoke with the pt and advised him that he needs to have his record updated as well as his ECG and labs prior to his cardiac cath... he is asking if he can wait until after his vacation May 25-June 3 or any other date suggested by Dr. [name].   I advised him that I will forward to [facility] for review when she returns to the office.

## 2020-05-30 ENCOUNTER — Telehealth: Payer: Self-pay | Admitting: *Deleted

## 2020-05-30 DIAGNOSIS — Z01812 Encounter for preprocedural laboratory examination: Secondary | ICD-10-CM

## 2020-05-30 NOTE — Telephone Encounter (Signed)
Called and spoke with patient. Arranged R/L HC for 06/27/20 7:30 am with Dr. Angelena Form. Dx: aortic stenosis/aortic insuff.  He will have labs and covid screen on 6/8 Instructions have been sent to him in [place].  Will need updated H&P prior to procedure.  He is aware we will call him to arrange this.

## 2020-06-13 ENCOUNTER — Ambulatory Visit: Payer: 59 | Admitting: Cardiology

## 2020-06-24 ENCOUNTER — Encounter: Payer: Self-pay | Admitting: Internal Medicine

## 2020-06-24 ENCOUNTER — Telehealth (INDEPENDENT_AMBULATORY_CARE_PROVIDER_SITE_OTHER): Payer: 59 | Admitting: Internal Medicine

## 2020-06-24 ENCOUNTER — Other Ambulatory Visit: Payer: Self-pay

## 2020-06-24 VITALS — Ht 70.0 in | Wt 190.0 lb

## 2020-06-24 DIAGNOSIS — I350 Nonrheumatic aortic (valve) stenosis: Secondary | ICD-10-CM

## 2020-06-24 NOTE — H&P (View-Only) (Signed)
Virtual Visit via Telephone Note   This visit type was conducted due to national recommendations for restrictions regarding the COVID-19 Pandemic (e.g. social distancing) in an effort to limit this patient's exposure and mitigate transmission in our community.  Due to his co-morbid illnesses, this patient is at least at moderate risk for complications without adequate follow up.  This format is felt to be most appropriate for this patient at this time.  The patient did not have access to video technology/had technical difficulties with video requiring transitioning to audio format only (telephone).  All issues noted in this document were discussed and addressed.  No physical exam could be performed with this format.  Please refer to the patient's chart for his  consent to telehealth for [facility].    Date:  06/24/2020   ID:  Kirk Campbell, DOB 1960/08/13, MRN [id number] The patient was identified using 2 identifiers.  Patient Location: Home Provider Location: Office/Clinic   PCP:  Myrlene Broker, MD   [facility] Providers Cardiologist:  Dorris Carnes, MD     Evaluation Performed:  Follow-Up Visit  Chief Complaint:  Pt presens for f/u of aortic valve dz   Plan for R and L heart cath 06/27/20  History of Present Illness:    Kirk Campbell is a 60 y.o. male with hx of aortic valve dz(AS/AI)   I saw the pt in Feb 2021  Echo showed LVEF and RVEF normal   AV with mean gradient of 33 mm Hg (unchanged from previous echo)   Mild AI   The pt went on to have a monitor after which showed SR, rates controlled    I saw the pt again in Novembe 2021  He was doing OK at time   Had a necho in Dec 2021  Mean gradient was 52 mm Hg    I reviewed images  Only one gradient that high   Felt to be more mod with mod AI    The pt went on to have a repeat echo on 04/15/20   This showed severe eccentric AI   Valve narrow with  mean gradient across AV 38 mm Hg  Dimensionless index was 0.27 At  that visit the pt complained on increased fatigue   Plan was to set up for R and L heart cath to eval coronary anatomy and define hemodynamics further  Since seen the pt has felt about the same   Denies CP   Does get fatigued   No dizzinss or syncope   The patient does not have symptoms concerning for COVID-19 infection (fever, chills, cough, or new shortness of breath).    Past Medical History:  Diagnosis Date  . Heart murmur   . Reflux   . Right inguinal hernia    Past Surgical History:  Procedure Laterality Date  . ANTERIOR CERVICAL DECOMP/DISCECTOMY FUSION N/A 12/27/2014   Procedure: C5-6, C6-7 Anterior Cervical Discectomy and Fusion, Allograft, Plate;  Surgeon: Marybelle Killings, MD;  Location: [facility];  Service: Orthopedics;  Laterality: N/A;  . NO PAST SURGERIES       Current Meds  Medication Sig  . aspirin EC 81 MG tablet Take 81 mg by mouth daily.  . Cholecalciferol (VITAMIN D3) 125 MCG (5000 UT) CAPS Take 20,000-25,000 Units by mouth See admin instructions. 20,000 units in the morning, 25,000 units in the evening  . Melatonin 10 MG TABS Take 10 mg by mouth at bedtime.  Marland Kitchen omeprazole (PRILOSEC OTC) 20  MG tablet Take 20 mg by mouth as needed.  . vitamin E 180 MG (400 UNITS) capsule Take 400 Units by mouth daily.     Allergies:   Patient has no known allergies.   Social History   Tobacco Use  . Smoking status: Never Smoker  . Smokeless tobacco: Never Used  Vaping Use  . Vaping Use: Never used  Substance Use Topics  . Alcohol use: Yes    Alcohol/week: 0.0 standard drinks    Comment: Socially     Family Hx: The patient's family history is negative for Cancer, Heart attack, Diabetes, Hyperlipidemia, and Hypertension.  ROS:   Please see the history of present illness.     All other systems reviewed and are negative.   Prior CV studies:   The following studies were reviewed today:   Latest echo:   04/15/20  1. Severe aortic valve regurgitation with moderate-severe  aortic valve stenosis. Unable to reproduce mean gradient from prior study. There are incompletely visualized but likely holodiastolic flow reversals in the descending aorta. The aortic valve is abnormal. Unable to determine aortic valve morphology due to calcifications. There is severe calcifcation of the aortic valve. Aortic valve regurgitation is severe. Moderate to severe aortic valve stenosis. Aortic regurgitation PHT measures 200 msec. Aortic valve mean gradient measures 38.0 mmHg. Aortic valve Vmax measures 4.03 m/s. 2. Left ventricular ejection fraction by 3D volume is 58 %. The left ventricle has normal function. The left ventricle has no regional wall motion abnormalities. The left ventricular internal cavity size was mildly dilated by indexed 3D volume. Left ventricular diastolic parameters are indeterminate. The average left ventricular global longitudinal strain is -21.1 %. The global longitudinal strain is normal. 3. Right ventricular systolic function is normal. The right ventricular size is normal. Tricuspid regurgitation signal is inadequate for assessing PA pressure. 4. The mitral valve is normal in structure. Mild mitral valve regurgitation. No evidence of mitral stenosis. 5. The inferior vena cava is normal in size with greater than 50% respiratory variability, suggesting right atrial pressure of 3 mmHg.  Lipid Panel  Labs/Other Tests and Data Reviewed:    EKG:  No ECG reviewed.  Recent Labs: No results found for requested labs within last 8760 hours.   Recent Lipid Panel No results found for: CHOL, TRIG, HDL, CHOLHDL, LDLCALC, LDLDIRECT  Wt Readings from Last 3 Encounters:  06/24/20 190 lb (86.2 kg)  04/30/20 188 lb (85.3 kg)  11/19/19 187 lb 3.2 oz (84.9 kg)     Objective:    Vital Signs:  Ht 5\' 10"  (1.778 m)   Wt 190 lb (86.2 kg)   BMI 27.26 kg/m    No vital signs to review  ASSESSMENT & PLAN:    1. Aortic valve dz.   Pt with severe AI,  mod/severe AS   Has developed symptoms of fatiguability    I have reviewed pathology with him   I would recomm L and R hart cath to further define hemodynamics and to r/o signif CAD   Risks/benefits for procedure discussed   Pt understands and agrees to proceed.   Will have labs tomorrow   Cath planned with C McAlhany for later this week          COVID-19 Education: The signs and symptoms of COVID-19 were discussed with the patient and how to seek care for testing (follow up with PCP or arrange E-visit).  The importance of social distancing was discussed today.  Time:   Today, I  have spent 20 minutes with the patient with telehealth technology discussing the above problems.     Medication Adjustments/Labs and Tests Ordered: Current medicines are reviewed at length with the patient today.  Concerns regarding medicines are outlined above.   Tests Ordered: No orders of the defined types were placed in this encounter.   Medication Changes: No orders of the defined types were placed in this encounter.   Follow Up:  In Person depending on test results  Signed, Dorris Carnes, MD  06/24/2020 1:07 PM    [place]

## 2020-06-24 NOTE — Patient Instructions (Addendum)
Medication Instructions:  Your physician recommends that you continue on your current medications as directed. Please refer to the Current Medication list given to you today.  *If you need a refill on your cardiac medications before your next appointment, please call your pharmacy*   Lab Work: Your physician recommends that you return for lab work in: Tomorrow as planned   If you have labs (blood work) drawn today and your tests are completely normal, you will receive your results only by: Marland Kitchen MyChart Message (if you have MyChart) OR . A paper copy in the mail If you have any lab test that is abnormal or we need to change your treatment, we will call you to review the results.   Testing/Procedures: Your physician has requested that you have a cardiac catheterization. Cardiac catheterization is used to diagnose and/or treat various heart conditions. Doctors may recommend this procedure for a number of different reasons. The most common reason is to evaluate chest pain. Chest pain can be a symptom of coronary artery disease (CAD), and cardiac catheterization can show whether plaque is narrowing or blocking your heart's arteries. This procedure is also used to evaluate the valves, as well as measure the blood flow and oxygen levels in different parts of your heart. For further information please visit HugeFiesta.tn. Please follow instruction sheet, as given.   Follow-Up: At [facility], you and your health needs are our priority.  As part of our continuing mission to provide you with exceptional heart care, we have created designated Provider Care Teams.  These Care Teams include your primary Cardiologist (physician) and Advanced Practice Providers (APPs -  Physician Assistants and Nurse Practitioners) who all work together to provide you with the care you need, when you need it.  We recommend signing up for the patient portal called "MyChart".  Sign up information is provided on this After  Visit Summary.  MyChart is used to connect with patients for Virtual Visits (Telemedicine).  Patients are able to view lab/test results, encounter notes, upcoming appointments, etc.  Non-urgent messages can be sent to your provider as well.   To learn more about what you can do with MyChart, go to [email or web address].    Your next appointment:    To Be Determined   The format for your next appointment:   In Person  Provider:   Dorris Carnes, MD   Other Instructions Thank you for choosing [facility]! ]

## 2020-06-24 NOTE — Progress Notes (Signed)
Virtual Visit via Telephone Note   This visit type was conducted due to national recommendations for restrictions regarding the COVID-19 Pandemic (e.g. social distancing) in an effort to limit this patient's exposure and mitigate transmission in our community.  Due to his co-morbid illnesses, this patient is at least at moderate risk for complications without adequate follow up.  This format is felt to be most appropriate for this patient at this time.  The patient did not have access to video technology/had technical difficulties with video requiring transitioning to audio format only (telephone).  All issues noted in this document were discussed and addressed.  No physical exam could be performed with this format.  Please refer to the patient's chart for his  consent to telehealth for [facility].    Date:  06/24/2020   ID:  Kirk Campbell, DOB 11/06/1960, MRN [id number] The patient was identified using 2 identifiers.  Patient Location: Home Provider Location: Office/Clinic   PCP:  Myrlene Broker, MD   [facility] Providers Cardiologist:  Dorris Carnes, MD     Evaluation Performed:  Follow-Up Visit  Chief Complaint:  Pt presens for f/u of aortic valve dz   Plan for R and L heart cath 06/27/20  History of Present Illness:    Kirk Campbell is a 60 y.o. male with hx of aortic valve dz(AS/AI)   I saw the pt in Feb 2021  Echo showed LVEF and RVEF normal   AV with mean gradient of 33 mm Hg (unchanged from previous echo)   Mild AI   The pt went on to have a monitor after which showed SR, rates controlled    I saw the pt again in Novembe 2021  He was doing OK at time   Had a necho in Dec 2021  Mean gradient was 52 mm Hg    I reviewed images  Only one gradient that high   Felt to be more mod with mod AI    The pt went on to have a repeat echo on 04/15/20   This showed severe eccentric AI   Valve narrow with  mean gradient across AV 38 mm Hg  Dimensionless index was 0.27 At  that visit the pt complained on increased fatigue   Plan was to set up for R and L heart cath to eval coronary anatomy and define hemodynamics further  Since seen the pt has felt about the same   Denies CP   Does get fatigued   No dizzinss or syncope   The patient does not have symptoms concerning for COVID-19 infection (fever, chills, cough, or new shortness of breath).    Past Medical History:  Diagnosis Date  . Heart murmur   . Reflux   . Right inguinal hernia    Past Surgical History:  Procedure Laterality Date  . ANTERIOR CERVICAL DECOMP/DISCECTOMY FUSION N/A 12/27/2014   Procedure: C5-6, C6-7 Anterior Cervical Discectomy and Fusion, Allograft, Plate;  Surgeon: Kirk Killings, MD;  Location: [facility];  Service: Orthopedics;  Laterality: N/A;  . NO PAST SURGERIES       Current Meds  Medication Sig  . aspirin EC 81 MG tablet Take 81 mg by mouth daily.  . Cholecalciferol (VITAMIN D3) 125 MCG (5000 UT) CAPS Take 20,000-25,000 Units by mouth See admin instructions. 20,000 units in the morning, 25,000 units in the evening  . Melatonin 10 MG TABS Take 10 mg by mouth at bedtime.  Marland Kitchen omeprazole (PRILOSEC OTC) 20  MG tablet Take 20 mg by mouth as needed.  . vitamin E 180 MG (400 UNITS) capsule Take 400 Units by mouth daily.     Allergies:   Patient has no known allergies.   Social History   Tobacco Use  . Smoking status: Never Smoker  . Smokeless tobacco: Never Used  Vaping Use  . Vaping Use: Never used  Substance Use Topics  . Alcohol use: Yes    Alcohol/week: 0.0 standard drinks    Comment: Socially     Family Hx: The patient's family history is negative for Cancer, Heart attack, Diabetes, Hyperlipidemia, and Hypertension.  ROS:   Please see the history of present illness.     All other systems reviewed and are negative.   Prior CV studies:   The following studies were reviewed today:   Latest echo:   04/15/20  1. Severe aortic valve regurgitation with moderate-severe  aortic valve stenosis. Unable to reproduce mean gradient from prior study. There are incompletely visualized but likely holodiastolic flow reversals in the descending aorta. The aortic valve is abnormal. Unable to determine aortic valve morphology due to calcifications. There is severe calcifcation of the aortic valve. Aortic valve regurgitation is severe. Moderate to severe aortic valve stenosis. Aortic regurgitation PHT measures 200 msec. Aortic valve mean gradient measures 38.0 mmHg. Aortic valve Vmax measures 4.03 m/s. 2. Left ventricular ejection fraction by 3D volume is 58 %. The left ventricle has normal function. The left ventricle has no regional wall motion abnormalities. The left ventricular internal cavity size was mildly dilated by indexed 3D volume. Left ventricular diastolic parameters are indeterminate. The average left ventricular global longitudinal strain is -21.1 %. The global longitudinal strain is normal. 3. Right ventricular systolic function is normal. The right ventricular size is normal. Tricuspid regurgitation signal is inadequate for assessing PA pressure. 4. The mitral valve is normal in structure. Mild mitral valve regurgitation. No evidence of mitral stenosis. 5. The inferior vena cava is normal in size with greater than 50% respiratory variability, suggesting right atrial pressure of 3 mmHg.  Lipid Panel  Labs/Other Tests and Data Reviewed:    EKG:  No ECG reviewed.  Recent Labs: No results found for requested labs within last 8760 hours.   Recent Lipid Panel No results found for: CHOL, TRIG, HDL, CHOLHDL, LDLCALC, LDLDIRECT  Wt Readings from Last 3 Encounters:  06/24/20 190 lb (86.2 kg)  04/30/20 188 lb (85.3 kg)  11/19/19 187 lb 3.2 oz (84.9 kg)     Objective:    Vital Signs:  Ht 5\' 10"  (1.778 m)   Wt 190 lb (86.2 kg)   BMI 27.26 kg/m    No vital signs to review  ASSESSMENT & PLAN:    1. Aortic valve dz.   Pt with severe AI,  mod/severe AS   Has developed symptoms of fatiguability    I have reviewed pathology with him   I would recomm L and R hart cath to further define hemodynamics and to r/o signif CAD   Risks/benefits for procedure discussed   Pt understands and agrees to proceed.   Will have labs tomorrow   Cath planned with C McAlhany for later this week          COVID-19 Education: The signs and symptoms of COVID-19 were discussed with the patient and how to seek care for testing (follow up with PCP or arrange E-visit).  The importance of social distancing was discussed today.  Time:   Today, I  have spent 20 minutes with the patient with telehealth technology discussing the above problems.     Medication Adjustments/Labs and Tests Ordered: Current medicines are reviewed at length with the patient today.  Concerns regarding medicines are outlined above.   Tests Ordered: No orders of the defined types were placed in this encounter.   Medication Changes: No orders of the defined types were placed in this encounter.   Follow Up:  In Person depending on test results  Signed, Dorris Carnes, MD  06/24/2020 1:07 PM    [place]

## 2020-06-25 ENCOUNTER — Other Ambulatory Visit (HOSPITAL_COMMUNITY): Payer: 59

## 2020-06-25 ENCOUNTER — Other Ambulatory Visit: Payer: 59 | Admitting: *Deleted

## 2020-06-25 DIAGNOSIS — Z01812 Encounter for preprocedural laboratory examination: Secondary | ICD-10-CM

## 2020-06-25 LAB — CBC
Hematocrit: 46.6 % (ref 37.5–51.0)
Hemoglobin: 15.7 g/dL (ref 13.0–17.7)
MCH: 30.4 pg (ref 26.6–33.0)
MCHC: 33.7 g/dL (ref 31.5–35.7)
MCV: 90 fL (ref 79–97)
Platelets: 188 10*3/uL (ref 150–450)
RBC: 5.17 x10E6/uL (ref 4.14–5.80)
RDW: 12.7 % (ref 11.6–15.4)
WBC: 7.1 10*3/uL (ref 3.4–10.8)

## 2020-06-25 LAB — BASIC METABOLIC PANEL
BUN/Creatinine Ratio: 17 (ref 10–24)
BUN: 16 mg/dL (ref 8–27)
CO2: 24 mmol/L (ref 20–29)
Calcium: 10.1 mg/dL (ref 8.6–10.2)
Chloride: 103 mmol/L (ref 96–106)
Creatinine, Ser: 0.96 mg/dL (ref 0.76–1.27)
Glucose: 102 mg/dL — ABNORMAL HIGH (ref 65–99)
Potassium: 4.3 mmol/L (ref 3.5–5.2)
Sodium: 143 mmol/L (ref 134–144)
eGFR: 90 mL/min/{1.73_m2} (ref >59)

## 2020-06-26 ENCOUNTER — Telehealth: Payer: Self-pay | Admitting: *Deleted

## 2020-06-26 NOTE — Telephone Encounter (Addendum)
Pt contacted pre-catheterization scheduled at [facility] for: Friday June 27, 2020 7:30 AM Verified arrival time and place: [facility]) at: 5:30 AM   No solid food after midnight prior to cath, clear liquids until 5 AM day of procedure.  AM meds can be  taken pre-cath with sips of water including: ASA 81 mg   Confirmed patient has responsible adult to drive home post procedure and be with patient first 24 hours after arriving home: yes  You are allowed ONE visitor in the waiting room during the time you are at the hospital for your procedure. Both you and your visitor must wear a mask once you enter the hospital.   Patient reports does not currently have any symptoms concerning for COVID-19 and no household members with COVID-19 like illness.       Reviewed procedure/mask/visitor instructions with patient.

## 2020-06-26 NOTE — Telephone Encounter (Signed)
Reviewed procedure/mask/visitor instructions with patient. 

## 2020-06-26 NOTE — Telephone Encounter (Signed)
Patient is returning call to discuss procedure instructions.

## 2020-06-27 ENCOUNTER — Ambulatory Visit (HOSPITAL_COMMUNITY)
Admission: RE | Admit: 2020-06-27 | Discharge: 2020-06-27 | Disposition: A | Discharge status: Home / Self Care | Payer: 59 | Attending: Cardiovascular Disease | Admitting: Cardiovascular Disease

## 2020-06-27 ENCOUNTER — Encounter (HOSPITAL_COMMUNITY)
Admission: RE | Disposition: A | Discharge status: Home / Self Care | Payer: Self-pay | Source: Home / Self Care | Attending: Cardiovascular Disease

## 2020-06-27 ENCOUNTER — Encounter (HOSPITAL_COMMUNITY): Payer: Self-pay | Admitting: Cardiovascular Disease

## 2020-06-27 DIAGNOSIS — I352 Nonrheumatic aortic (valve) stenosis with insufficiency: Secondary | ICD-10-CM | POA: Diagnosis present

## 2020-06-27 DIAGNOSIS — Z7982 Long term (current) use of aspirin: Secondary | ICD-10-CM | POA: Insufficient documentation

## 2020-06-27 DIAGNOSIS — I351 Nonrheumatic aortic (valve) insufficiency: Secondary | ICD-10-CM

## 2020-06-27 HISTORY — PX: RIGHT/LEFT HEART CATH AND CORONARY ANGIOGRAPHY: CATH118266

## 2020-06-27 LAB — POCT I-STAT EG7
Acid-Base Excess: 1 mmol/L (ref 0.0–2.0)
Bicarbonate: 27.9 mmol/L (ref 20.0–28.0)
Calcium, Ion: 1.22 mmol/L (ref 1.15–1.40)
HCT: 42 % (ref 39.0–52.0)
Hemoglobin: 14.3 g/dL (ref 13.0–17.0)
O2 Saturation: 81 %
Potassium: 3.8 mmol/L (ref 3.5–5.1)
Sodium: 145 mmol/L (ref 135–145)
TCO2: 30 mmol/L (ref 22–32)
pCO2, Ven: 52.2 mmHg (ref 44.0–60.0)
pH, Ven: 7.337 (ref 7.250–7.430)
pO2, Ven: 49 mmHg — ABNORMAL HIGH (ref 32.0–45.0)

## 2020-06-27 LAB — POCT I-STAT 7, (LYTES, BLD GAS, ICA,H+H)
Acid-Base Excess: 1 mmol/L (ref 0.0–2.0)
Bicarbonate: 27 mmol/L (ref 20.0–28.0)
Calcium, Ion: 1.04 mmol/L — ABNORMAL LOW (ref 1.15–1.40)
HCT: 39 % (ref 39.0–52.0)
Hemoglobin: 13.3 g/dL (ref 13.0–17.0)
O2 Saturation: 100 %
Potassium: 3.4 mmol/L — ABNORMAL LOW (ref 3.5–5.1)
Sodium: 147 mmol/L — ABNORMAL HIGH (ref 135–145)
TCO2: 28 mmol/L (ref 22–32)
pCO2 arterial: 47.1 mmHg (ref 32.0–48.0)
pH, Arterial: 7.365 (ref 7.350–7.450)
pO2, Arterial: 193 mmHg — ABNORMAL HIGH (ref 83.0–108.0)

## 2020-06-27 SURGERY — RIGHT/LEFT HEART CATH AND CORONARY ANGIOGRAPHY
Anesthesia: LOCAL

## 2020-06-27 MED ORDER — SODIUM CHLORIDE 0.9% FLUSH: 3.0000 mL | INTRAVENOUS | Status: DC | PRN

## 2020-06-27 MED ORDER — SODIUM CHLORIDE 0.9 % IV SOLN: INTRAVENOUS | Status: AC

## 2020-06-27 MED ORDER — MIDAZOLAM HCL 2 MG/2ML IJ SOLN
INTRAMUSCULAR | Status: AC
  Filled 2020-06-27: qty 2

## 2020-06-27 MED ORDER — HEPARIN SODIUM (PORCINE) 1000 UNIT/ML IJ SOLN
INTRAMUSCULAR | Status: AC
  Filled 2020-06-27: qty 1

## 2020-06-27 MED ORDER — IOHEXOL 350 MG/ML SOLN
INTRAVENOUS | Status: DC | PRN
  Administered 2020-06-27: 40 mL

## 2020-06-27 MED ORDER — SODIUM CHLORIDE 0.9% FLUSH: 3.0000 mL | Freq: Two times a day (BID) | INTRAVENOUS | Status: DC

## 2020-06-27 MED ORDER — LIDOCAINE HCL (PF) 1 % IJ SOLN
INTRAMUSCULAR | Status: DC | PRN
  Administered 2020-06-27: 4 mL

## 2020-06-27 MED ORDER — HEPARIN (PORCINE) IN NACL 1000-0.9 UT/500ML-% IV SOLN
INTRAVENOUS | Status: DC | PRN
  Administered 2020-06-27 (×2): 500 mL

## 2020-06-27 MED ORDER — HYDRALAZINE HCL 20 MG/ML IJ SOLN: 10.0000 mg | INTRAMUSCULAR | Status: DC | PRN

## 2020-06-27 MED ORDER — MIDAZOLAM HCL 2 MG/2ML IJ SOLN
INTRAMUSCULAR | Status: DC | PRN
  Administered 2020-06-27: 2 mg via INTRAVENOUS

## 2020-06-27 MED ORDER — VERAPAMIL HCL 2.5 MG/ML IV SOLN
INTRAVENOUS | Status: AC
  Filled 2020-06-27: qty 2

## 2020-06-27 MED ORDER — SODIUM CHLORIDE 0.9 % IV SOLN: INTRAVENOUS | Status: DC

## 2020-06-27 MED ORDER — SODIUM CHLORIDE 0.9 % IV SOLN: 250.0000 mL | INTRAVENOUS | Status: DC | PRN

## 2020-06-27 MED ORDER — ACETAMINOPHEN 325 MG PO TABS: 650.0000 mg | ORAL_TABLET | ORAL | Status: DC | PRN

## 2020-06-27 MED ORDER — ONDANSETRON HCL 4 MG/2ML IJ SOLN: 4.0000 mg | Freq: Four times a day (QID) | INTRAMUSCULAR | Status: DC | PRN

## 2020-06-27 MED ORDER — HEPARIN SODIUM (PORCINE) 1000 UNIT/ML IJ SOLN
INTRAMUSCULAR | Status: DC | PRN
Start: 2020-06-27 — End: 2020-06-27
  Administered 2020-06-27: 4000 [IU] via INTRAVENOUS

## 2020-06-27 MED ORDER — FENTANYL CITRATE (PF) 100 MCG/2ML IJ SOLN
INTRAMUSCULAR | Status: AC
  Filled 2020-06-27: qty 2

## 2020-06-27 MED ORDER — ASPIRIN 81 MG PO CHEW: 81.0000 mg | CHEWABLE_TABLET | ORAL | Status: DC

## 2020-06-27 MED ORDER — LABETALOL HCL 5 MG/ML IV SOLN: 10.0000 mg | INTRAVENOUS | Status: DC | PRN

## 2020-06-27 MED ORDER — VERAPAMIL HCL 2.5 MG/ML IV SOLN
INTRAVENOUS | Status: DC | PRN
  Administered 2020-06-27: 10 mL via INTRA_ARTERIAL

## 2020-06-27 MED ORDER — FENTANYL CITRATE (PF) 100 MCG/2ML IJ SOLN
INTRAMUSCULAR | Status: DC | PRN
  Administered 2020-06-27 (×2): 25 ug via INTRAVENOUS

## 2020-06-27 MED ORDER — LIDOCAINE HCL (PF) 1 % IJ SOLN
INTRAMUSCULAR | Status: AC
  Filled 2020-06-27: qty 30

## 2020-06-27 SURGICAL SUPPLY — 14 items
CATH 5FR JL3.5 JR4 ANG PIG MP (CATHETERS) ×2 IMPLANT
CATH BALLN WEDGE 5F 110CM (CATHETERS) ×2 IMPLANT
DEVICE RAD COMP TR BAND LRG (VASCULAR PRODUCTS) ×2 IMPLANT
GLIDESHEATH SLEND A-KIT 6F 22G (SHEATH) ×2 IMPLANT
GLIDESHEATH SLEND SS 6F .021 (SHEATH) ×2 IMPLANT
GUIDEWIRE INQWIRE 1.5J.035X260 (WIRE) ×1 IMPLANT
INQWIRE 1.5J .035X260CM (WIRE) ×2
KIT HEART LEFT (KITS) ×2 IMPLANT
PACK CARDIAC CATHETERIZATION (CUSTOM PROCEDURE TRAY) ×2 IMPLANT
SHEATH GLIDE SLENDER 4/5FR (SHEATH) ×2 IMPLANT
TRANSDUCER W/STOPCOCK (MISCELLANEOUS) ×2 IMPLANT
TUBING ART PRESS 72  MALE/FEM (TUBING) ×2
TUBING ART PRESS 72 MALE/FEM (TUBING) ×1 IMPLANT
TUBING CIL FLEX 10 FLL-RA (TUBING) ×2 IMPLANT

## 2020-06-27 NOTE — Research (Signed)
Identify Informed Consent   Subject Name: Kirk Campbell  Subject met inclusion and exclusion criteria.  The informed consent form, study requirements and expectations were reviewed with the subject and questions and concerns were addressed prior to the signing of the consent form.  The subject verbalized understanding of the trail requirements.  The subject agreed to participate in the Identify trial and signed the informed consent.  The informed consent was obtained prior to performance of any protocol-specific procedures for the subject.  A copy of the signed informed consent was given to the subject and a copy was placed in the subject's medical record.  [name] 06/27/2020, 0646 am

## 2020-06-27 NOTE — Progress Notes (Signed)
Pt ambulatory to bathroom, tolerated well right wrist gauze, tape, armboard intact.

## 2020-06-27 NOTE — Interval H&P Note (Signed)
History and Physical Interval Note:  06/27/2020 7:10 AM  Kirk Campbell  has presented today for surgery, with the diagnosis of aortic stenosis.  The various methods of treatment have been discussed with the patient and family. After consideration of risks, benefits and other options for treatment, the patient has consented to  Procedure(s): RIGHT/LEFT HEART CATH AND CORONARY ANGIOGRAPHY (N/A) as a surgical intervention.  The patient's history has been reviewed, patient examined, no change in status, stable for surgery.  I have reviewed the patient's chart and labs.  Questions were answered to the patient's satisfaction.    Cath Lab Visit (complete for each Cath Lab visit)  Clinical Evaluation Leading to the Procedure:   ACS: No.  Non-ACS:    Anginal Classification: No Symptoms  Anti-ischemic medical therapy: No Therapy  Non-Invasive Test Results: No non-invasive testing performed  Prior CABG: No previous CABG        Kirk Campbell

## 2020-06-30 ENCOUNTER — Other Ambulatory Visit: Payer: Self-pay | Admitting: *Deleted

## 2020-06-30 DIAGNOSIS — I350 Nonrheumatic aortic (valve) stenosis: Secondary | ICD-10-CM

## 2020-06-30 NOTE — Progress Notes (Deleted)
Ct a 

## 2020-07-02 ENCOUNTER — Ambulatory Visit
Admission: RE | Admit: 2020-07-02 | Discharge: 2020-07-02 | Disposition: A | Discharge status: Home / Self Care | Payer: 59 | Source: Ambulatory Visit | Attending: Surgery | Admitting: Surgery

## 2020-07-02 DIAGNOSIS — I350 Nonrheumatic aortic (valve) stenosis: Secondary | ICD-10-CM

## 2020-07-02 MED ORDER — IOPAMIDOL (ISOVUE-370) INJECTION 76%
75.0000 mL | Freq: Once | INTRAVENOUS | Status: AC | PRN
  Administered 2020-07-02: 75 mL via INTRAVENOUS

## 2020-07-14 ENCOUNTER — Encounter: Payer: 59 | Admitting: Surgery

## 2020-07-16 ENCOUNTER — Encounter: Payer: Self-pay | Admitting: *Deleted

## 2020-07-16 ENCOUNTER — Encounter: Payer: Self-pay | Admitting: Surgery

## 2020-07-16 ENCOUNTER — Other Ambulatory Visit: Payer: Self-pay

## 2020-07-16 ENCOUNTER — Other Ambulatory Visit: Payer: Self-pay | Admitting: *Deleted

## 2020-07-16 ENCOUNTER — Institutional Professional Consult (permissible substitution): Payer: 59 | Admitting: Surgery

## 2020-07-16 VITALS — BP 138/73 | HR 73 | Resp 20 | Ht 70.0 in | Wt 185.0 lb

## 2020-07-16 DIAGNOSIS — I351 Nonrheumatic aortic (valve) insufficiency: Secondary | ICD-10-CM

## 2020-07-16 DIAGNOSIS — I350 Nonrheumatic aortic (valve) stenosis: Secondary | ICD-10-CM

## 2020-07-18 NOTE — Progress Notes (Signed)
Cardiothoracic Surgery Consultation  PCP is Myrlene Broker, MD Referring Provider is Burnell Blanks*  Chief Complaint  Patient presents with   Aortic Insuffiency    Surgical consult, Cardiac Cath 06/27/20, ECHO 04/15/20, CTA Chest 07/02/20    HPI:  The patient is a 60 year old gentleman with a history of aortic stenosis and aortic insufficiency followed by Dr. [name].  2D echocardiogram on 01/09/2020 showed a severely calcified and thickened aortic valve with severe aortic stenosis with a mean gradient of 52 mmHg and a peak gradient of 91 mmHg.  Dimensionless index was 0.22 and aortic valve area 0.8 cm.  There was felt to be moderate aortic insufficiency.  Left ventricular ejection fraction was 60 to 65% with a left ventricular internal diameter during diastole of 5.5 cm.  There is mild mitral regurgitation.  He was feeling fine at that time but now presents with increasing exertional fatigue and shortness of breath.  He has had no chest pressure or pain.  He has had some dizzy spells recently but no syncope.  He said no peripheral edema.  2D echo on 04/15/2020 showed a mean gradient of 38 mmHg with a peak gradient of 65 mmHg.  There is felt to be severe aortic insufficiency with an AI pressure half-time of 200 ms.  Left ventricular internal diameter during diastole was 5.7 cm.  Left ventricular ejection fraction was 58%.  He subsequently underwent cardiac catheterization showing no evidence of coronary disease.  Filling pressures were normal.  The mean gradient across aortic valve was 38.7 mmHg with a peak to peak gradient of 35 mmHg and a valve area of 1.0 cm.   Past Medical History:  Diagnosis Date   Heart murmur    Reflux    Right inguinal hernia     Past Surgical History:  Procedure Laterality Date   ANTERIOR CERVICAL DECOMP/DISCECTOMY FUSION N/A 12/27/2014   Procedure: C5-6, C6-7 Anterior Cervical Discectomy and Fusion, Allograft, Plate;  Surgeon: Marybelle Killings, MD;   Location: [place];  Service: Orthopedics;  Laterality: N/A;   NO PAST SURGERIES     RIGHT/LEFT HEART CATH AND CORONARY ANGIOGRAPHY N/A 06/27/2020   Procedure: RIGHT/LEFT HEART CATH AND CORONARY ANGIOGRAPHY;  Surgeon: Burnell Blanks, MD;  Location: [place] [facility];  Service: Cardiovascular;  Laterality: N/A;    Family History  Problem Relation Age of Onset   Cancer Neg Hx    Heart attack Neg Hx    Diabetes Neg Hx    Hyperlipidemia Neg Hx    Hypertension Neg Hx     Social History Social History   Tobacco Use   Smoking status: Never   Smokeless tobacco: Never  Vaping Use   Vaping Use: Never used  Substance Use Topics   Alcohol use: Yes    Alcohol/week: 0.0 standard drinks    Comment: Socially    Current Outpatient Medications  Medication Sig Dispense Refill   aspirin EC 81 MG tablet Take 81 mg by mouth daily.     Cholecalciferol (VITAMIN D3) 125 MCG (5000 UT) CAPS Take 20,000-25,000 Units by mouth See admin instructions. 20,000 units in the morning, 25,000 units in the evening     Melatonin 10 MG TABS Take 10 mg by mouth at bedtime.     omeprazole (PRILOSEC OTC) 20 MG tablet Take 20 mg by mouth as needed.     vitamin E 180 MG (400 UNITS) capsule Take 400 Units by mouth daily.     No current facility-administered  medications for this visit.    No Known Allergies  Review of Systems  Constitutional:  Positive for activity change and fatigue. Negative for chills, fever and unexpected weight change.  HENT: Negative.  Negative for dental problem.        Last saw dentist in June 2022  Eyes: Negative.   Respiratory:  Positive for shortness of breath.   Cardiovascular:  Negative for chest pain and leg swelling.  Gastrointestinal: Negative.        Heartburn  Endocrine: Negative.   Genitourinary: Negative.   Musculoskeletal: Negative.        Leg cramps at night  Skin: Negative.   Allergic/Immunologic: Negative.   Neurological:  Positive for dizziness and  light-headedness. Negative for syncope.  Hematological:  Bruises/bleeds easily.  Psychiatric/Behavioral: Negative.     BP 138/73   Pulse 73   Resp 20   Ht 5\' 10"  (1.778 m)   Wt 185 lb (83.9 kg)   SpO2 98% Comment: RA  BMI 26.54 kg/m  Physical Exam Constitutional:      Appearance: Normal appearance. He is normal weight.  HENT:     Head: Normocephalic and atraumatic.  Eyes:     Extraocular Movements: Extraocular movements intact.     Conjunctiva/sclera: Conjunctivae normal.     Pupils: Pupils are equal, round, and reactive to light.  Neck:     Comments: Transmitted murmur to both sides of the neck Cardiovascular:     Rate and Rhythm: Normal rate and regular rhythm.     Pulses: Normal pulses.     Heart sounds: Murmur heard.     Comments: 3/6 systolic murmur along the right sternal border, 2/6 diastolic murmur along the left lower sternal border with diminished S2. Pulmonary:     Effort: Pulmonary effort is normal.     Breath sounds: Normal breath sounds.  Abdominal:     General: Abdomen is flat.     Palpations: Abdomen is soft.  Musculoskeletal:        General: No swelling.     Cervical back: Normal range of motion and neck supple.  Skin:    General: Skin is warm and dry.  Neurological:     General: No focal deficit present.     Mental Status: He is alert and oriented to person, place, and time.  Psychiatric:        Mood and Affect: Mood normal.        Behavior: Behavior normal.        Thought Content: Thought content normal.        Judgment: Judgment normal.     Diagnostic Tests:    ECHOCARDIOGRAM REPORT         Patient Name:   Kirk Campbell Date of Exam: 04/15/2020  Medical Rec #:  [id number]        Height:       70.0 in  Accession #:    [id number]       Weight:       187.2 lb  Date of Birth:  07/23/1960       BSA:          2.029 m  Patient Age:    60 years         BP:           122/58 mmHg  Patient Gender: M                HR:  72 bpm.  Exam  Location:  [facility]   Procedure: 2D Echo, 3D Echo, Cardiac Doppler, Color Doppler and Strain  Analysis   Indications:    I35.1 Aortic insufficiency     History:        Patient has prior history of Echocardiogram examinations,  most                  recent 01/09/2020. Aortic Valve Disease.     Sonographer:    Jessee Avers, RDCS  Referring Phys: [id number] PAULA V ROSS   IMPRESSIONS     1. Severe aortic valve regurgitation with moderate-severe aortic valve  stenosis. Unable to reproduce mean gradient from prior study. There are  incompletely visualized but likely holodiastolic flow reversals in the  descending aorta. The aortic valve is  abnormal. Unable to determine aortic valve morphology due to  calcifications. There is severe calcifcation of the aortic valve. Aortic  valve regurgitation is severe. Moderate to severe aortic valve stenosis.  Aortic regurgitation PHT measures 200 msec.  Aortic valve mean gradient measures 38.0 mmHg. Aortic valve Vmax measures  4.03 m/s.   2. Left ventricular ejection fraction by 3D volume is 58 %. The left  ventricle has normal function. The left ventricle has no regional wall  motion abnormalities. The left ventricular internal cavity size was mildly  dilated by indexed 3D volume. Left  ventricular diastolic parameters are indeterminate. The average left  ventricular global longitudinal strain is -21.1 %. The global longitudinal  strain is normal.   3. Right ventricular systolic function is normal. The right ventricular  size is normal. Tricuspid regurgitation signal is inadequate for assessing  PA pressure.   4. The mitral valve is normal in structure. Mild mitral valve  regurgitation. No evidence of mitral stenosis.   5. The inferior vena cava is normal in size with greater than 50%  respiratory variability, suggesting right atrial pressure of 3 mmHg.   FINDINGS   Left Ventricle: Left ventricular ejection fraction by 3D volume is 58 %.   The left ventricle has normal function. The left ventricle has no regional  wall motion abnormalities. The average left ventricular global  longitudinal strain is -21.1 %. The global   longitudinal strain is normal. The left ventricular internal cavity size  was mildly dilated. There is no left ventricular hypertrophy. Left  ventricular diastolic parameters are indeterminate.   Right Ventricle: The right ventricular size is normal. No increase in  right ventricular wall thickness. Right ventricular systolic function is  normal. Tricuspid regurgitation signal is inadequate for assessing PA  pressure.   Left Atrium: Left atrial size was normal in size.   Right Atrium: Right atrial size was normal in size.   Pericardium: There is no evidence of pericardial effusion.   Mitral Valve: The mitral valve is normal in structure. Mild mitral valve  regurgitation. No evidence of mitral valve stenosis.   Tricuspid Valve: The tricuspid valve is normal in structure. Tricuspid  valve regurgitation is not demonstrated. No evidence of tricuspid  stenosis.   Aortic Valve: Severe aortic valve regurgitation with moderate-severe  aortic valve stenosis. Unable to reproduce mean gradient from prior study.  There are incompletely visualized but likely holodiastolic flow reversals  in the descending aorta. The aortic  valve is abnormal. There is severe calcifcation of the aortic valve.  Aortic valve regurgitation is severe. Aortic regurgitation PHT measures  200 msec. Moderate to severe aortic stenosis is present. Aortic valve mean  gradient  measures 38.0 mmHg. Aortic  valve peak gradient measures 65.0 mmHg. Aortic valve area, by VTI measures  1.20 cm.   Pulmonic Valve: The pulmonic valve was not well visualized. Pulmonic valve  regurgitation is trivial. No evidence of pulmonic stenosis.   Aorta: The aortic root is normal in size and structure.   Venous: The inferior vena cava is normal in size  with greater than 50%  respiratory variability, suggesting right atrial pressure of 3 mmHg.   IAS/Shunts: No atrial level shunt detected by color flow Doppler.      LEFT VENTRICLE  PLAX 2D  LVIDd:         5.70 cm         Diastology  LVIDs:         3.90 cm         LV e' medial:    7.13 cm/s  LV PW:         0.80 cm         LV E/e' medial:  16.1  LV IVS:        0.90 cm         LV e' lateral:   7.20 cm/s  LVOT diam:     2.40 cm         LV E/e' lateral: 16.0  LV SV:         117  LV SV Index:   58              2D  LVOT Area:     4.52 cm        Longitudinal                                 Strain                                 2D Strain GLS  -19.0 %                                 (A2C):                                 2D Strain GLS  -22.1 %                                 (A3C):                                 2D Strain GLS  -22.0 %                                 (A4C):                                 2D Strain GLS  -21.1 %                                 Avg:  3D Volume EF                                 LV 3D EF:    Left                                              ventricular                                              ejection                                              fraction by                                              3D volume                                              is 58 %.                                    3D Volume EF:                                 3D EF:        58 %                                 LV EDV:       173 ml                                 LV ESV:       73 ml                                 LV SV:        100 ml   RIGHT VENTRICLE  RV Basal diam:  3.40 cm  RV S prime:     10.10 cm/s  TAPSE (M-mode): 2.5 cm   LEFT ATRIUM             Index       RIGHT ATRIUM           Index  LA diam:        3.80 cm 1.87 cm/m  RA Area:     11.70 cm  LA Vol (A2C):   49.8 ml 24.54 ml/m RA Volume:   24.50 ml  12.07 ml/m  LA  Vol (A4C):   48.1 ml 23.70 ml/m  LA Biplane Vol: 50.6 ml 24.93 ml/m   AORTIC VALVE  AV Area (Vmax):    1.06 cm  AV Area (Vmean):   1.06 cm  AV Area (VTI):     1.20 cm  AV Vmax:           403.00 cm/s  AV Vmean:          281.500 cm/s  AV VTI:            0.973 m  AV Peak Grad:      65.0 mmHg  AV Mean Grad:      38.0 mmHg  LVOT Vmax:         94.60 cm/s  LVOT Vmean:        65.700 cm/s  LVOT VTI:          0.258 m  LVOT/AV VTI ratio: 0.27  AI PHT:            200 msec     AORTA  Ao Root diam: 3.50 cm  Ao Asc diam:  3.40 cm   MITRAL VALVE                              SHUNTS                              Systemic VTI:  0.26 m  MV E velocity: 115.00 cm/s  Systemic Diam: 2.40 cm  MV A velocity: 36.90 cm/s  MV E/A ratio:  3.12   Cherlynn Kaiser MD  Electronically signed by Cherlynn Kaiser MD  Signature Date/Time: 04/15/2020/4:29:02 PM     Physicians  Panel Physicians Referring Physician Case Authorizing Physician  Burnell Blanks, MD (Primary)      Procedures  RIGHT/LEFT HEART CATH AND CORONARY ANGIOGRAPHY    Conclusion  1. No angiographic evidence of CAD 2. Normal filling pressures. 3. Moderate aortic stenosis (mean gradient 38.7 mmHg, peak to peak gradient 35 mmHg, AVA 1.0 cm2). Severe aortic insufficiency by echo.   Recommendations: He will need referral to CT surgery to discuss aortic valve replacement. Will discuss starting with CT scans prior to surgery evaluation.    Indications  Severe aortic insufficiency [I35.1 (ICD-10-CM)]    Procedural Details  Technical Details Indication: Severe aortic insufficiency/moderate aortic stenosis.   Procedure: The risks, benefits, complications, treatment options, and expected outcomes were discussed with the patient. The patient and/or family concurred with the proposed plan, giving informed consent. The patient was brought to the cath lab after IV hydration was given. The patient was sedated with Versed and  Fentanyl. The right wrist was prepped and draped in a sterile fashion. 1% lidocaine was used for local anesthesia. Using the modified Seldinger access technique, a 5 French sheath was placed in the right radial artery. 3 mg Verapamil was given through the sheath. 4000 units IV heparin was given. Standard diagnostic catheters were used to perform selective coronary angiography. I crossed the aortic valve with the JR4 catheter and J wire. The sheath was removed from the right radial artery and a Terumo hemostasis band was applied at the arteriotomy site on the right wrist.    Estimated blood loss <50 mL.   During this procedure medications were administered to achieve and maintain moderate conscious sedation while the patient's heart rate, blood pressure, and oxygen saturation were continuously monitored and I was present face-to-face 100% of this time.  Medications (Filter: Administrations occurring from 0722 to 0819 on 06/27/20)  important  Continuous medications are totaled by the amount administered until 06/27/20 0819.    Heparin (Porcine) in NaCl 1000-0.9 UT/500ML-% SOLN (mL) Total volume:  1,000 mL  Date/Time Rate/Dose/Volume Action   06/27/20 0729 500 mL Given   0729 500 mL Given    fentaNYL (SUBLIMAZE) injection (mcg) Total dose:  50 mcg  Date/Time Rate/Dose/Volume Action   06/27/20 0734 25 mcg Given   0738 25 mcg Given    midazolam (VERSED) injection (mg) Total dose:  2 mg  Date/Time Rate/Dose/Volume Action   06/27/20 0734 2 mg Given    lidocaine (PF) (XYLOCAINE) 1 % injection (mL) Total volume:  4 mL  Date/Time Rate/Dose/Volume Action   06/27/20 0742 4 mL Given    Radial Cocktail/Verapamil only (mL) Total volume:  10 mL  Date/Time Rate/Dose/Volume Action   06/27/20 0746 10 mL Given    heparin sodium (porcine) injection (Units) Total dose:  4,000 Units  Date/Time Rate/Dose/Volume Action   06/27/20 0750 4,000 Units Given    iohexol (OMNIPAQUE) 350 MG/ML  injection (mL) Total volume:  40 mL  Date/Time Rate/Dose/Volume Action   06/27/20 0810 40 mL Given     Sedation Time  Sedation Time Physician-1: 23 minutes 55 seconds    Radiation/Fluoro  Fluoro time: 3.3 (min) DAP: 7560 (mGycm2) Cumulative Air Kerma: 098 (mGy)  Complications   Complications documented before study signed (06/27/2020  8:22 AM)     RIGHT/LEFT HEART CATH AND CORONARY ANGIOGRAPHY  None Documented by Burnell Blanks, MD 06/27/2020  8:03 AM  Date Found: 06/27/2020  Time Range: Intraprocedure        Coronary Findings   Diagnostic Dominance: Right  Left Anterior Descending  Vessel is large.  Left Circumflex  Vessel is large.  Right Coronary Artery  Vessel is large.   Intervention   No interventions have been documented.          Coronary Diagrams   Diagnostic Dominance: Right    Intervention     Implants     No implant documentation for this case.    Syngo Images   Show images for CARDIAC CATHETERIZATION  Images on Long Term Storage   Show images for [place], Imel to Procedure Log  Procedure Log      Hemo Data  Flowsheet Row Most Recent Value  Fick Cardiac Output 6.99 L/min  Fick Cardiac Output Index 3.46 (L/min)/BSA  Aortic Mean Gradient 38.7 mmHg  Aortic Peak Gradient 35 mmHg  Aortic Valve Area 1.00  Aortic Value Area Index 0.49 cm2/BSA  RA A Wave 7 mmHg  RA V Wave 4 mmHg  RA Mean 4 mmHg  RV Systolic Pressure 30 mmHg  RV Diastolic Pressure 5 mmHg  RV EDP 7 mmHg  PA Systolic Pressure 33 mmHg  PA Diastolic Pressure 12 mmHg  PA Mean 23 mmHg  PW A Wave 15 mmHg  PW V Wave 14 mmHg  PW Mean 13 mmHg  AO Systolic Pressure 119 mmHg  AO Diastolic Pressure 60 mmHg  AO Mean 81 mmHg  LV Systolic Pressure 147 mmHg  LV Diastolic Pressure 7 mmHg  LV EDP 24 mmHg  AOp Systolic Pressure 829 mmHg  AOp Diastolic Pressure 58 mmHg  AOp Mean Pressure 81 mmHg  LVp Systolic Pressure 562 mmHg   LVp Diastolic Pressure 7 mmHg  LVp EDP Pressure 33 mmHg  QP/QS 1  TPVR Index 6.65 HRUI  TSVR Index 23.41 HRUI  PVR SVR Ratio 0.13  TPVR/TSVR Ratio 0.28    Impression:  This 60 year old gentleman has stage D, severe, symptomatic aortic stenosis and aortic insufficiency with New York Heart Association class II symptoms of exertional fatigue and shortness of breath consistent with chronic diastolic congestive heart failure.  He is also having episodes of dizziness and lightheadedness with activity I have personally reviewed his 2D echocardiogram and cardiac catheterization studies.  His echo shows a severely thickened and calcified aortic valve with markedly restricted mobility.  He mean gradient and December 2021 was 52 mmHg but was reduced to 38 mmHg on his most recent echo in March 2022.  His ejection fraction is minimally decreased.  His AI pressure half-time has decreased further to 200 ms consistent with severe AI.  I agree that aortic valve replacement is indicated in this patient for relief of his symptoms and to prevent progressive left ventricular deterioration.  I reviewed his echo and cath films with him and his wife and answered their questions.  I discussed the alternatives of mechanical and bioprosthetic valves.  Given his age I think a bioprosthetic valve would be a good choice and avoid the need for long-term anticoagulation with Coumadin. I discussed the operative procedure with the patient and his wife including alternatives, benefits and risks; including but not limited to bleeding, blood transfusion, infection, stroke, myocardial infarction, graft failure, heart block requiring a permanent pacemaker, organ dysfunction, and death.  [name] understands and agrees to proceed.  He has a vacation/family reunion in the first week of August and therefore we have scheduled surgery for September 01, 2020.  I cautioned him against exerting himself to the point of having symptoms and told  him that if his symptoms worsen that he should contact our office so that we can modify the operative date.  Plan:  Aortic valve replacement using a bioprosthetic valve on September 01, 2020  I spent 60 minutes performing this consultation and > 50% of this time was spent face to face counseling and coordinating the care of this patient's severe symptomatic aortic stenosis and aortic insufficiency.  Gaye Pollack, MD [facility] [phone]

## 2020-08-07 ENCOUNTER — Telehealth: Payer: Self-pay | Admitting: Internal Medicine

## 2020-08-07 NOTE — Telephone Encounter (Signed)
HIM received an FMLA form. HIM called and spoke to the patient to inform him of the form process, $29 fee, and authorization we will need to complete the form.  AO 08/07/20

## 2020-08-15 DIAGNOSIS — Z0279 Encounter for issue of other medical certificate: Secondary | ICD-10-CM

## 2020-08-15 NOTE — Telephone Encounter (Addendum)
Patient came into the office to make $29 payment and sign authorization. HIM has placed the form in Dr. Alan Ripper box to be completed.  AO 08/15/20

## 2020-08-18 DIAGNOSIS — R011 Cardiac murmur, unspecified: Secondary | ICD-10-CM

## 2020-08-18 HISTORY — DX: Cardiac murmur, unspecified: R01.1

## 2020-08-27 NOTE — Telephone Encounter (Signed)
HIM has received the completed form from the nurse. The form has been faxed to [facility] as requested via Epic. AO 08/27/20

## 2020-08-27 NOTE — Telephone Encounter (Signed)
[employer] [employer] [facility] forms completed and signed by Dr. [name] and returned to medical records.

## 2020-08-28 ENCOUNTER — Ambulatory Visit (HOSPITAL_COMMUNITY)
Admission: RE | Admit: 2020-08-28 | Discharge: 2020-08-28 | Disposition: A | Discharge status: Home / Self Care | Payer: 59 | Source: Ambulatory Visit | Attending: Surgery | Admitting: Surgery

## 2020-08-28 ENCOUNTER — Encounter (HOSPITAL_COMMUNITY)
Admission: RE | Admit: 2020-08-28 | Discharge: 2020-08-28 | Disposition: A | Discharge status: Home / Self Care | Payer: 59 | Source: Ambulatory Visit | Attending: Surgery | Admitting: Surgery

## 2020-08-28 ENCOUNTER — Other Ambulatory Visit: Payer: Self-pay

## 2020-08-28 ENCOUNTER — Encounter (HOSPITAL_COMMUNITY): Payer: Self-pay

## 2020-08-28 DIAGNOSIS — I350 Nonrheumatic aortic (valve) stenosis: Secondary | ICD-10-CM

## 2020-08-28 DIAGNOSIS — Z20822 Contact with and (suspected) exposure to covid-19: Secondary | ICD-10-CM | POA: Insufficient documentation

## 2020-08-28 DIAGNOSIS — I351 Nonrheumatic aortic (valve) insufficiency: Secondary | ICD-10-CM | POA: Insufficient documentation

## 2020-08-28 DIAGNOSIS — Z01818 Encounter for other preprocedural examination: Secondary | ICD-10-CM | POA: Insufficient documentation

## 2020-08-28 HISTORY — DX: Gastro-esophageal reflux disease without esophagitis: K21.9

## 2020-08-28 LAB — APTT: aPTT: 30 seconds (ref 24–36)

## 2020-08-28 LAB — HEMOGLOBIN A1C
Hgb A1c MFr Bld: 5.5 % (ref 4.8–5.6)
Mean Plasma Glucose: 111.15 mg/dL

## 2020-08-28 LAB — SARS CORONAVIRUS 2 (TAT 6-24 HRS): SARS Coronavirus 2: NEGATIVE

## 2020-08-28 LAB — URINALYSIS, ROUTINE W REFLEX MICROSCOPIC
Bilirubin Urine: NEGATIVE
Glucose, UA: NEGATIVE mg/dL
Hgb urine dipstick: NEGATIVE
Ketones, ur: NEGATIVE mg/dL
Leukocytes,Ua: NEGATIVE
Nitrite: NEGATIVE
Protein, ur: NEGATIVE mg/dL
Specific Gravity, Urine: 1.004 — ABNORMAL LOW (ref 1.005–1.030)
pH: 7 (ref 5.0–8.0)

## 2020-08-28 LAB — CBC
HCT: 46.1 % (ref 39.0–52.0)
Hemoglobin: 14.6 g/dL (ref 13.0–17.0)
MCH: 30 pg (ref 26.0–34.0)
MCHC: 31.7 g/dL (ref 30.0–36.0)
MCV: 94.7 fL (ref 80.0–100.0)
Platelets: 223 10*3/uL (ref 150–400)
RBC: 4.87 MIL/uL (ref 4.22–5.81)
RDW: 13.6 % (ref 11.5–15.5)
WBC: 6.8 10*3/uL (ref 4.0–10.5)
nRBC: 0 % (ref 0.0–0.2)

## 2020-08-28 LAB — COMPREHENSIVE METABOLIC PANEL
ALT: 18 U/L (ref 0–44)
AST: 24 U/L (ref 15–41)
Albumin: 3.8 g/dL (ref 3.5–5.0)
Alkaline Phosphatase: 55 U/L (ref 38–126)
Anion gap: 8 (ref 5–15)
BUN: 13 mg/dL (ref 6–20)
CO2: 23 mmol/L (ref 22–32)
Calcium: 9 mg/dL (ref 8.9–10.3)
Chloride: 107 mmol/L (ref 98–111)
Creatinine, Ser: 0.94 mg/dL (ref 0.61–1.24)
GFR, Estimated: >60 mL/min (ref >60)
Glucose, Bld: 110 mg/dL — ABNORMAL HIGH (ref 70–99)
Potassium: 3.8 mmol/L (ref 3.5–5.1)
Sodium: 138 mmol/L (ref 135–145)
Total Bilirubin: 0.8 mg/dL (ref 0.3–1.2)
Total Protein: 6.3 g/dL — ABNORMAL LOW (ref 6.5–8.1)

## 2020-08-28 LAB — BLOOD GAS, ARTERIAL
Acid-Base Excess: 1.1 mmol/L (ref 0.0–2.0)
Bicarbonate: 25.1 mmol/L (ref 20.0–28.0)
Drawn by: 58793
FIO2: 21
O2 Saturation: 97.7 %
Patient temperature: 37
pCO2 arterial: 39.8 mmHg (ref 32.0–48.0)
pH, Arterial: 7.417 (ref 7.350–7.450)
pO2, Arterial: 95.7 mmHg (ref 83.0–108.0)

## 2020-08-28 LAB — SURGICAL PCR SCREEN
MRSA, PCR: NEGATIVE
Staphylococcus aureus: NEGATIVE

## 2020-08-28 LAB — PROTIME-INR
INR: 1 (ref 0.8–1.2)
Prothrombin Time: 13.2 seconds (ref 11.4–15.2)

## 2020-08-28 NOTE — Progress Notes (Signed)
Pre-CABG (AVR) Dopplers completed. Refer to "CV Proc" under chart review to view preliminary results.  08/28/2020 1:35 PM Kelby Aline., MHA, RVT, RDCS, RDMS

## 2020-08-28 NOTE — Progress Notes (Addendum)
Surgical Instructions    Your procedure is scheduled on 09/01/20.  Report to [facility] Main Entrance "A" at 05:30 A.M., then check in with the Admitting office.  Call this number if you have problems the morning of surgery:  [phone]   If you have any questions prior to your surgery date call [phone]: Open Monday-Friday 8am-4pm    Remember:  Do not eat or drink after midnight the night before your surgery      Take these medicines the morning of surgery with A SIP OF WATER: NONE   Please stop taking NSAIDS 7 days prior to surgery. This includes aspirin, ibuprofen and aleve.   As of today, STOP taking any  (unless otherwise instructed by your surgeon) Aleve, Naproxen, Ibuprofen, Motrin, Advil, Goody's, BC's, all herbal medications, fish oil, and all vitamins.          Do not wear jewelry or makeup Do not wear lotions, powders, perfumes/colognes, or deodorant. Men may shave face and neck. Do not bring valuables to the hospital.  DO Not wear nail polish, gel polish, artificial nails, or any other type of covering on natural nails  including finger and toenails. If patients have artificial nails, gel coating, etc. that need to be removed by a nail salon please have this removed prior to surgery or surgery may need to be canceled/delayed if the surgeon/ anesthesia feels like the patient is unable to be adequately monitored.             [facility] is not responsible for any belongings or valuables.  Do NOT Smoke (Tobacco/Vaping) or drink Alcohol 24 hours prior to your procedure If you use a CPAP at night, you may bring all equipment for your overnight stay.   Contacts, glasses, dentures or bridgework may not be worn into surgery, please bring cases for these belongings   For patients admitted to the hospital, discharge time will be determined by your treatment team.   Patients discharged the day of surgery will not be allowed to drive home, and someone needs to stay with  them for 24 hours.  ONLY 1 SUPPORT PERSON MAY BE PRESENT WHILE YOU ARE IN SURGERY. IF YOU ARE TO BE ADMITTED ONCE YOU ARE IN YOUR ROOM YOU WILL BE ALLOWED TWO (2) VISITORS.  Minor children may have two parents present. Special consideration for safety and communication needs will be reviewed on a case by case basis.  Special instructions:    Oral Hygiene is also important to reduce your risk of infection.  Remember - BRUSH YOUR TEETH THE MORNING OF SURGERY WITH YOUR REGULAR TOOTHPASTE   [facility]- Preparing For Surgery  Before surgery, you can play an important role. Because skin is not sterile, your skin needs to be as free of germs as possible. You can reduce the number of germs on your skin by washing with CHG (chlorahexidine gluconate) Soap before surgery.  CHG is an antiseptic cleaner which kills germs and bonds with the skin to continue killing germs even after washing.     Please do not use if you have an allergy to CHG or antibacterial soaps. If your skin becomes reddened/irritated stop using the CHG.  Do not shave (including legs and underarms) for at least 48 hours prior to first CHG shower. It is OK to shave your face.  Please follow these instructions carefully.     Shower the NIGHT BEFORE SURGERY and the MORNING OF SURGERY with CHG Soap.   If you chose  to wash your hair, wash your hair first as usual with your normal shampoo. After you shampoo, rinse your hair and body thoroughly to remove the shampoo.  Then [employer] and genitals (private parts) with your normal soap and rinse thoroughly to remove soap.  After that Use CHG Soap as you would any other liquid soap. You can apply CHG directly to the skin and wash gently with a scrungie or a clean washcloth.   Apply the CHG Soap to your body ONLY FROM THE NECK DOWN.  Do not use on open wounds or open sores. Avoid contact with your eyes, ears, mouth and genitals (private parts). Wash Face and genitals (private parts)  with your  normal soap.   Wash thoroughly, paying special attention to the area where your surgery will be performed.  Thoroughly rinse your body with warm water from the neck down.  DO NOT shower/wash with your normal soap after using and rinsing off the CHG Soap.  Pat yourself dry with a CLEAN TOWEL.  Wear CLEAN PAJAMAS to bed the night before surgery  Place CLEAN SHEETS on your bed the night before your surgery  DO NOT SLEEP WITH PETS.   Day of Surgery: Take a shower with CHG soap. Wear Clean/Comfortable clothing the morning of surgery Do not apply any deodorants/lotions.   Remember to brush your teeth WITH YOUR REGULAR TOOTHPASTE.   Please read over the following fact sheets that you were given.

## 2020-08-28 NOTE — Progress Notes (Signed)
PCP - Herbie [place] robbins Linna Darner) Cardiologist - Dorris Carnes  PPM/ICD - denies   Chest x-ray - 08/28/20 EKG - 08/28/20 Stress Test -  ECHO - 04/15/20 Cardiac Cath - 06/27/20  Sleep Study - denies   No diabetes  Please stop taking NSAIDS 7 days prior to surgery. This includes aspirin, ibuprofen and aleve.    As of today, STOP taking any  (unless otherwise instructed by your surgeon) Aleve, Naproxen, Ibuprofen, Motrin, Advil, Goody's, BC's, all herbal medications, fish oil, and all vitamins.           ERAS Protcol -no   COVID TEST- 8/11 in PAT   Anesthesia review: yes, cardiac history  Patient denies shortness of breath, fever, cough and chest pain at PAT appointment   All instructions explained to the patient, with a verbal understanding of the material. Patient agrees to go over the instructions while at home for a better understanding. Patient also instructed to self quarantine after being tested for COVID-19. The opportunity to ask questions was provided.

## 2020-08-29 ENCOUNTER — Encounter (HOSPITAL_COMMUNITY): Payer: Self-pay

## 2020-08-29 MED ORDER — MILRINONE LACTATE IN DEXTROSE 20-5 MG/100ML-% IV SOLN
0.3000 ug/kg/min | INTRAVENOUS | Status: DC
  Filled 2020-08-29: qty 100

## 2020-08-29 MED ORDER — POTASSIUM CHLORIDE 2 MEQ/ML IV SOLN
80.0000 meq | INTRAVENOUS | Status: DC
  Filled 2020-08-29: qty 40

## 2020-08-29 MED ORDER — EPINEPHRINE HCL 5 MG/250ML IV SOLN IN NS
0.0000 ug/min | INTRAVENOUS | Status: DC
  Filled 2020-08-29: qty 250

## 2020-08-29 MED ORDER — CEFAZOLIN SODIUM-DEXTROSE 2-4 GM/100ML-% IV SOLN
2.0000 g | INTRAVENOUS | Status: DC
  Filled 2020-08-29 (×2): qty 100

## 2020-08-29 MED ORDER — PHENYLEPHRINE HCL-NACL 20-0.9 MG/250ML-% IV SOLN
30.0000 ug/min | INTRAVENOUS | Status: AC
  Administered 2020-09-01: 25 ug/min via INTRAVENOUS
  Filled 2020-08-29: qty 250

## 2020-08-29 MED ORDER — TRANEXAMIC ACID 1000 MG/10ML IV SOLN
1.5000 mg/kg/h | INTRAVENOUS | Status: AC
  Administered 2020-09-01: 1.5 mg/kg/h via INTRAVENOUS
  Filled 2020-08-29: qty 25

## 2020-08-29 MED ORDER — CEFAZOLIN SODIUM-DEXTROSE 2-4 GM/100ML-% IV SOLN
2.0000 g | INTRAVENOUS | Status: AC
  Administered 2020-09-01 (×2): 2 g via INTRAVENOUS
  Filled 2020-08-29: qty 100

## 2020-08-29 MED ORDER — NITROGLYCERIN IN D5W 200-5 MCG/ML-% IV SOLN
2.0000 ug/min | INTRAVENOUS | Status: AC
  Administered 2020-09-01: 20 ug/min via INTRAVENOUS
  Filled 2020-08-29: qty 250

## 2020-08-29 MED ORDER — MANNITOL 20 % IV SOLN
Freq: Once | INTRAVENOUS | Status: DC
  Filled 2020-08-29: qty 13

## 2020-08-29 MED ORDER — TRANEXAMIC ACID (OHS) PUMP PRIME SOLUTION
2.0000 mg/kg | INTRAVENOUS | Status: DC
  Filled 2020-08-29: qty 1.69

## 2020-08-29 MED ORDER — TRANEXAMIC ACID (OHS) BOLUS VIA INFUSION
15.0000 mg/kg | INTRAVENOUS | Status: AC
  Administered 2020-09-01: 1270.5 mg via INTRAVENOUS
  Filled 2020-08-29: qty 1271

## 2020-08-29 MED ORDER — NOREPINEPHRINE 4 MG/250ML-% IV SOLN
0.0000 ug/min | INTRAVENOUS | Status: DC
  Filled 2020-08-29: qty 250

## 2020-08-29 MED ORDER — SODIUM CHLORIDE 0.9 % IV SOLN
INTRAVENOUS | Status: DC
  Filled 2020-08-29: qty 30

## 2020-08-29 MED ORDER — DEXMEDETOMIDINE HCL IN NACL 400 MCG/100ML IV SOLN
0.1000 ug/kg/h | INTRAVENOUS | Status: AC
  Administered 2020-09-01: .5 ug/kg/h via INTRAVENOUS
  Filled 2020-08-29: qty 100

## 2020-08-29 MED ORDER — INSULIN REGULAR(HUMAN) IN NACL 100-0.9 UT/100ML-% IV SOLN
INTRAVENOUS | Status: AC
  Administered 2020-09-01: 1.4 [IU]/h via INTRAVENOUS
  Filled 2020-08-29: qty 100

## 2020-08-29 MED ORDER — VANCOMYCIN HCL 1500 MG/300ML IV SOLN
1500.0000 mg | INTRAVENOUS | Status: AC
  Administered 2020-09-01: 1500 mg via INTRAVENOUS
  Filled 2020-08-29: qty 300

## 2020-08-29 MED ORDER — PLASMA-LYTE A IV SOLN
INTRAVENOUS | Status: DC
  Filled 2020-08-29 (×2): qty 5

## 2020-08-29 NOTE — Progress Notes (Signed)
Anesthesia Chart Review:  Case: [id number] Date/Time: 09/01/20 0715   Procedures:      AORTIC VALVE REPLACEMENT (AVR) (Chest)     TRANSESOPHAGEAL ECHOCARDIOGRAM (TEE)   Anesthesia type: General   Pre-op diagnosis:      SEVERE AI     AS   Location: [facility] OR ROOM 15 / [place] [facility]   Surgeons: Gaye Pollack, MD       DISCUSSION: Patient is a 60 year old male scheduled for the above procedure.  History includes never smoker, GERD, murmur (severe AI, moderate-severe AS), spinal surgery (C5-7 ACDF 12/27/14).   Preoperative COVID-19 test negative on 08/28/20. Anesthesia team to evaluate on the day of surgery.   VS: BP 130/67   Pulse 76   Temp 36.8 C (Oral)   Resp 18   Ht '5\' 10"'$  (1.778 m)   Wt 84.7 kg   SpO2 100%   BMI 26.80 kg/m   PROVIDERS: Myrlene Broker, MD is PCP  Dorris Carnes, MD is cardiologist   LABS: Labs reviewed: Acceptable for surgery. (all labs ordered are listed, but only abnormal results are displayed)  Labs Reviewed  COMPREHENSIVE METABOLIC PANEL - Abnormal; Notable for the following components:      Result Value   Glucose, Bld 110 (*)    Total Protein 6.3 (*)    All other components within normal limits  URINALYSIS, ROUTINE W REFLEX MICROSCOPIC - Abnormal; Notable for the following components:   Color, Urine STRAW (*)    Specific Gravity, Urine 1.004 (*)    All other components within normal limits  SURGICAL PCR SCREEN  SARS CORONAVIRUS 2 (TAT 6-24 HRS)  CBC  PROTIME-INR  APTT  BLOOD GAS, ARTERIAL  HEMOGLOBIN A1C  TYPE AND SCREEN     IMAGES: CXR 08/28/20:  FINDINGS: The heart size and mediastinal contours are within normal limits. Both lungs are clear. The visualized skeletal structures are unremarkable.  IMPRESSION: No active cardiopulmonary disease.  CTA Chest 07/02/20: IMPRESSION: 1. Negative for thoracic aortic dissection. Thoracic aorta ectasias, see below measurements.  Sinuses of Valsalva: 3.2 cm. Sinotubular junction: 2.1 cm. Mid  ascending aorta: 3.8 cm. Proximal aortic arch: 3.1 cm. Distal arch: 2.3 cm. Mid descending aorta: 2.4 cm. - Recommend annual imaging followup by CTA or MRA. This recommendation follows 2010 ACCF/AHA/AATS/ACR/ASA/SCA/SCAI/SIR/STS/SVM Guidelines for the Diagnosis and Management of Patients with Thoracic Aortic Disease. Circulation.2010; [id number]. Aortic aneurysm NOS (ICD10-I71.9) 2. Aortic valve calcifications. 3. Lungs are clear.   EKG: 08/28/20: Normal sinus rhythm Possible Left atrial enlargement Left ventricular hypertrophy ( R in aVL , Cornell product ) Nonspecific T wave abnormality Abnormal ECG No old tracing to compare Confirmed by [place], Peter [phone]) on 08/28/2020 2:54:58 PM   CV: US Carotid US 08/28/20: Summary:  - Right Carotid: The extracranial vessels were near-normal with only minimal  wall thickening or plaque.  - Left Carotid: The extracranial vessels were near-normal with only minimal  wall thickening or plaque.  - Vertebrals:  Bilateral vertebral arteries demonstrate antegrade flow.  - Subclavians: Normal flow hemodynamics were seen in bilateral subclavian arteries.    RHC/LHC 06/27/20: 1. No angiographic evidence of CAD 2. Normal filling pressures.  3. Moderate aortic stenosis (mean gradient 38.7 mmHg, peak to peak gradient 35 mmHg, AVA 1.0 cm2). Severe aortic insufficiency by echo.  Recommendations: He will need referral to CT surgery to discuss aortic valve replacement. Will discuss starting with CT scans prior to surgery evaluation.    Echo 04/15/20: IMPRESSIONS   1.  Severe aortic valve regurgitation with moderate-severe aortic valve  stenosis. Unable to reproduce mean gradient from prior study. There are  incompletely visualized but likely holodiastolic flow reversals in the  descending aorta. The aortic valve is  abnormal. Unable to determine aortic valve morphology due to  calcifications. There is severe calcifcation of the aortic valve. Aortic   valve regurgitation is severe. Moderate to severe aortic valve stenosis.  Aortic regurgitation PHT measures 200 msec.  Aortic valve mean gradient measures 38.0 mmHg. Aortic valve Vmax measures  4.03 m/s.   2. Left ventricular ejection fraction by 3D volume is 58 %. The left  ventricle has normal function. The left ventricle has no regional wall  motion abnormalities. The left ventricular internal cavity size was mildly  dilated by indexed 3D volume. Left  ventricular diastolic parameters are indeterminate. The average left  ventricular global longitudinal strain is -21.1 %. The global longitudinal  strain is normal.   3. Right ventricular systolic function is normal. The right ventricular  size is normal. Tricuspid regurgitation signal is inadequate for assessing  PA pressure.   4. The mitral valve is normal in structure. Mild mitral valve  regurgitation. No evidence of mitral stenosis.   5. The inferior vena cava is normal in size with greater than 50%  respiratory variability, suggesting right atrial pressure of 3 mmHg.    Long term cardiac monitor 03/09/19-03/13/19: Study Highlights SInus rhythm  55 to 138 bpm   Average HR 75 bpm Rare PVC   Past Medical History:  Diagnosis Date   GERD (gastroesophageal reflux disease)    Heart murmur    Reflux    Right inguinal hernia     Past Surgical History:  Procedure Laterality Date   ANTERIOR CERVICAL DECOMP/DISCECTOMY FUSION N/A 12/27/2014   Procedure: C5-6, C6-7 Anterior Cervical Discectomy and Fusion, Allograft, Plate;  Surgeon: Marybelle Killings, MD;  Location: [facility];  Service: Orthopedics;  Laterality: N/A;   HERNIA REPAIR Right    inguinal hernia   NO PAST SURGERIES     RIGHT/LEFT HEART CATH AND CORONARY ANGIOGRAPHY N/A 06/27/2020   Procedure: RIGHT/LEFT HEART CATH AND CORONARY ANGIOGRAPHY;  Surgeon: Burnell Blanks, MD;  Location: [facility] [place] [facility];  Service: Cardiovascular;  Laterality: N/A;    MEDICATIONS:  aspirin  EC 81 MG tablet   Cholecalciferol (VITAMIN D3) 125 MCG (5000 UT) CAPS   ibuprofen (ADVIL) 200 MG tablet   Magnesium 400 MG TABS   Melatonin 10 MG TABS   naproxen sodium (ALEVE) 220 MG tablet   omeprazole (PRILOSEC OTC) 20 MG tablet   Vitamin E 450 MG (1000 UT) CAPS   No current facility-administered medications for this encounter.    Myra Gianotti, PA-C Surgical Short Stay/Anesthesiology [facility] Phone [phone] [facility] Phone [phone] 08/29/2020 1:01 PM

## 2020-08-29 NOTE — Anesthesia Preprocedure Evaluation (Addendum)
Anesthesia Evaluation  Patient identified by MRN, date of birth, ID band Patient awake    Reviewed: Allergy & Precautions, NPO status , Patient's Chart, lab work & pertinent test results  Airway Mallampati: III  TM Distance: >3 FB Neck ROM: Full    Dental no notable dental hx.    Pulmonary neg pulmonary ROS,    Pulmonary exam normal breath sounds clear to auscultation       Cardiovascular + Valvular Problems/Murmurs AS and AI  Rhythm:Regular Rate:Normal + Systolic murmurs and + Diastolic murmurs    Neuro/Psych negative neurological ROS  negative psych ROS   GI/Hepatic Neg liver ROS, GERD  Medicated and Controlled,  Endo/Other  negative endocrine ROS  Renal/GU negative Renal ROS     Musculoskeletal negative musculoskeletal ROS (+)   Abdominal   Peds  Hematology negative hematology ROS (+)   Anesthesia Other Findings SEVERE AI  AS  Reproductive/Obstetrics                            Anesthesia Physical Anesthesia Plan  ASA: 4  Anesthesia Plan: General   Post-op Pain Management:    Induction: Intravenous  PONV Risk Score and Plan: 2 and Ondansetron, Dexamethasone, Midazolam and Treatment may vary due to age or medical condition  Airway Management Planned: Oral ETT  Additional Equipment: Arterial line, CVP, PA Cath, TEE and Ultrasound Guidance Line Placement  Intra-op Plan:   Post-operative Plan: Post-operative intubation/ventilation  Informed Consent: I have reviewed the patients History and Physical, chart, labs and discussed the procedure including the risks, benefits and alternatives for the proposed anesthesia with the patient or authorized representative who has indicated his/her understanding and acceptance.     Dental advisory given  Plan Discussed with: CRNA  Anesthesia Plan Comments: (Reviewed PAT note written 08/29/2020 by Myra Gianotti, PA-C. )       Anesthesia Quick Evaluation

## 2020-08-31 NOTE — H&P (Signed)
[address]       [place] [address]             [phone]      Cardiothoracic Surgery Admission History and Physical   PCP is Myrlene Broker, MD Referring Provider is Burnell Blanks* Primary Cardiologist: Dorris Carnes, MD     Chief Complaint  Patient presents with   Aortic Insuffiency and aortic stenosis            HPI:   The patient is a 60 year old gentleman with a history of aortic stenosis and aortic insufficiency followed by Dr. [name].  2D echocardiogram on 01/09/2020 showed a severely calcified and thickened aortic valve with severe aortic stenosis with a mean gradient of 52 mmHg and a peak gradient of 91 mmHg.  Dimensionless index was 0.22 and aortic valve area 0.8 cm.  There was felt to be moderate aortic insufficiency.  Left ventricular ejection fraction was 60 to 65% with a left ventricular internal diameter during diastole of 5.5 cm.  There is mild mitral regurgitation.  He was feeling fine at that time but now presents with increasing exertional fatigue and shortness of breath.  He has had no chest pressure or pain.  He has had some dizzy spells recently but no syncope.  He said no peripheral edema.  2D echo on 04/15/2020 showed a mean gradient of 38 mmHg with a peak gradient of 65 mmHg.  There is felt to be severe aortic insufficiency with an AI pressure half-time of 200 ms.  Left ventricular internal diameter during diastole was 5.7 cm.  Left ventricular ejection fraction was 58%.  He subsequently underwent cardiac catheterization showing no evidence of coronary disease.  Filling pressures were normal.  The mean gradient across aortic valve was 38.7 mmHg with a peak to peak gradient of 35 mmHg and a valve area of 1.0 cm.         Past Medical History:  Diagnosis Date   Heart murmur     Reflux     Right inguinal hernia             Past Surgical History:  Procedure Laterality Date   ANTERIOR CERVICAL DECOMP/DISCECTOMY FUSION N/A  12/27/2014    Procedure: C5-6, C6-7 Anterior Cervical Discectomy and Fusion, Allograft, Plate;  Surgeon: Marybelle Killings, MD;  Location: [facility];  Service: Orthopedics;  Laterality: N/A;   NO PAST SURGERIES       RIGHT/LEFT HEART CATH AND CORONARY ANGIOGRAPHY N/A 06/27/2020    Procedure: RIGHT/LEFT HEART CATH AND CORONARY ANGIOGRAPHY;  Surgeon: Burnell Blanks, MD;  Location: [facility];  Service: Cardiovascular;  Laterality: N/A;           Family History  Problem Relation Age of Onset   Cancer Neg Hx     Heart attack Neg Hx     Diabetes Neg Hx     Hyperlipidemia Neg Hx     Hypertension Neg Hx        Social History Social History         Tobacco Use   Smoking status: Never   Smokeless tobacco: Never  Vaping Use   Vaping Use: Never used  Substance Use Topics   Alcohol use: Yes      Alcohol/week: 0.0 standard drinks      Comment: Socially            Current Outpatient Medications  Medication Sig Dispense Refill  aspirin EC 81 MG tablet Take 81 mg by mouth daily.       Cholecalciferol (VITAMIN D3) 125 MCG (5000 UT) CAPS Take 20,000-25,000 Units by mouth See admin instructions. 20,000 units in the morning, 25,000 units in the evening       Melatonin 10 MG TABS Take 10 mg by mouth at bedtime.       omeprazole (PRILOSEC OTC) 20 MG tablet Take 20 mg by mouth as needed.       vitamin E 180 MG (400 UNITS) capsule Take 400 Units by mouth daily.        No current facility-administered medications for this visit.      No Known Allergies   Review of Systems  Constitutional:  Positive for activity change and fatigue. Negative for chills, fever and unexpected weight change.  HENT: Negative.  Negative for dental problem.        Last saw dentist in June 2022  Eyes: Negative.   Respiratory:  Positive for shortness of breath.   Cardiovascular:  Negative for chest pain and leg swelling.  Gastrointestinal: Negative.        Heartburn  Endocrine: Negative.   Genitourinary:  Negative.   Musculoskeletal: Negative.        Leg cramps at night  Skin: Negative.   Allergic/Immunologic: Negative.   Neurological:  Positive for dizziness and light-headedness. Negative for syncope.  Hematological:  Bruises/bleeds easily.  Psychiatric/Behavioral: Negative.      BP 138/73   Pulse 73   Resp 20   Ht '5\' 10"'$  (1.778 m)   Wt 185 lb (83.9 kg)   SpO2 98% Comment: RA  BMI 26.54 kg/m  Physical Exam Constitutional:      Appearance: Normal appearance. He is normal weight.  HENT:     Head: Normocephalic and atraumatic.  Eyes:     Extraocular Movements: Extraocular movements intact.     Conjunctiva/sclera: Conjunctivae normal.     Pupils: Pupils are equal, round, and reactive to light.  Neck:     Comments: Transmitted murmur to both sides of the neck Cardiovascular:     Rate and Rhythm: Normal rate and regular rhythm.     Pulses: Normal pulses.     Heart sounds: Murmur heard.     Comments: 3/6 systolic murmur along the right sternal border, 2/6 diastolic murmur along the left lower sternal border with diminished S2. Pulmonary:     Effort: Pulmonary effort is normal.     Breath sounds: Normal breath sounds.  Abdominal:     General: Abdomen is flat.     Palpations: Abdomen is soft.  Musculoskeletal:        General: No swelling.     Cervical back: Normal range of motion and neck supple.  Skin:    General: Skin is warm and dry.  Neurological:     General: No focal deficit present.     Mental Status: He is alert and oriented to person, place, and time.  Psychiatric:        Mood and Affect: Mood normal.        Behavior: Behavior normal.        Thought Content: Thought content normal.        Judgment: Judgment normal.        Diagnostic Tests:     ECHOCARDIOGRAM REPORT         Patient Name:   Kirk Campbell Date of Exam: 04/15/2020  Medical Rec #:  [id number]  Height:       70.0 in  Accession #:    [id number]       Weight:       187.2 lb  Date of  Birth:  July 31, 1960       BSA:          2.029 m  Patient Age:    52 years         BP:           122/58 mmHg  Patient Gender: M                HR:           72 bpm.  Exam Location:  [facility]   Procedure: 2D Echo, 3D Echo, Cardiac Doppler, Color Doppler and Strain  Analysis   Indications:    I35.1 Aortic insufficiency     History:        Patient has prior history of Echocardiogram examinations,  most                  recent 01/09/2020. Aortic Valve Disease.     Sonographer:    Jessee Avers, RDCS  Referring Phys: [id number] PAULA V ROSS   IMPRESSIONS     1. Severe aortic valve regurgitation with moderate-severe aortic valve  stenosis. Unable to reproduce mean gradient from prior study. There are  incompletely visualized but likely holodiastolic flow reversals in the  descending aorta. The aortic valve is  abnormal. Unable to determine aortic valve morphology due to  calcifications. There is severe calcifcation of the aortic valve. Aortic  valve regurgitation is severe. Moderate to severe aortic valve stenosis.  Aortic regurgitation PHT measures 200 msec.  Aortic valve mean gradient measures 38.0 mmHg. Aortic valve Vmax measures  4.03 m/s.   2. Left ventricular ejection fraction by 3D volume is 58 %. The left  ventricle has normal function. The left ventricle has no regional wall  motion abnormalities. The left ventricular internal cavity size was mildly  dilated by indexed 3D volume. Left  ventricular diastolic parameters are indeterminate. The average left  ventricular global longitudinal strain is -21.1 %. The global longitudinal  strain is normal.   3. Right ventricular systolic function is normal. The right ventricular  size is normal. Tricuspid regurgitation signal is inadequate for assessing  PA pressure.   4. The mitral valve is normal in structure. Mild mitral valve  regurgitation. No evidence of mitral stenosis.   5. The inferior vena cava is normal in size with  greater than 50%  respiratory variability, suggesting right atrial pressure of 3 mmHg.   FINDINGS   Left Ventricle: Left ventricular ejection fraction by 3D volume is 58 %.  The left ventricle has normal function. The left ventricle has no regional  wall motion abnormalities. The average left ventricular global  longitudinal strain is -21.1 %. The global   longitudinal strain is normal. The left ventricular internal cavity size  was mildly dilated. There is no left ventricular hypertrophy. Left  ventricular diastolic parameters are indeterminate.   Right Ventricle: The right ventricular size is normal. No increase in  right ventricular wall thickness. Right ventricular systolic function is  normal. Tricuspid regurgitation signal is inadequate for assessing PA  pressure.   Left Atrium: Left atrial size was normal in size.   Right Atrium: Right atrial size was normal in size.   Pericardium: There is no evidence of pericardial effusion.   Mitral Valve: The mitral valve is  normal in structure. Mild mitral valve  regurgitation. No evidence of mitral valve stenosis.   Tricuspid Valve: The tricuspid valve is normal in structure. Tricuspid  valve regurgitation is not demonstrated. No evidence of tricuspid  stenosis.   Aortic Valve: Severe aortic valve regurgitation with moderate-severe  aortic valve stenosis. Unable to reproduce mean gradient from prior study.  There are incompletely visualized but likely holodiastolic flow reversals  in the descending aorta. The aortic  valve is abnormal. There is severe calcifcation of the aortic valve.  Aortic valve regurgitation is severe. Aortic regurgitation PHT measures  200 msec. Moderate to severe aortic stenosis is present. Aortic valve mean  gradient measures 38.0 mmHg. Aortic  valve peak gradient measures 65.0 mmHg. Aortic valve area, by VTI measures  1.20 cm.   Pulmonic Valve: The pulmonic valve was not well visualized. Pulmonic valve   regurgitation is trivial. No evidence of pulmonic stenosis.   Aorta: The aortic root is normal in size and structure.   Venous: The inferior vena cava is normal in size with greater than 50%  respiratory variability, suggesting right atrial pressure of 3 mmHg.   IAS/Shunts: No atrial level shunt detected by color flow Doppler.      LEFT VENTRICLE  PLAX 2D  LVIDd:         5.70 cm         Diastology  LVIDs:         3.90 cm         LV e' medial:    7.13 cm/s  LV PW:         0.80 cm         LV E/e' medial:  16.1  LV IVS:        0.90 cm         LV e' lateral:   7.20 cm/s  LVOT diam:     2.40 cm         LV E/e' lateral: 16.0  LV SV:         117  LV SV Index:   58              2D  LVOT Area:     4.52 cm        Longitudinal                                 Strain                                 2D Strain GLS  -19.0 %                                 (A2C):                                 2D Strain GLS  -22.1 %                                 (A3C):                                 2D Strain GLS  -  22.0 %                                 (A4C):                                 2D Strain GLS  -21.1 %                                 Avg:                                    3D Volume EF                                 LV 3D EF:    Left                                              ventricular                                              ejection                                              fraction by                                              3D volume                                              is 58 %.                                    3D Volume EF:                                 3D EF:        58 %                                 LV EDV:       173 ml                                 LV ESV:       73 ml  LV SV:        100 ml   RIGHT VENTRICLE  RV Basal diam:  3.40 cm  RV S prime:     10.10 cm/s  TAPSE (M-mode): 2.5 cm   LEFT ATRIUM              Index       RIGHT ATRIUM           Index  LA diam:        3.80 cm 1.87 cm/m  RA Area:     11.70 cm  LA Vol (A2C):   49.8 ml 24.54 ml/m RA Volume:   24.50 ml  12.07 ml/m  LA Vol (A4C):   48.1 ml 23.70 ml/m  LA Biplane Vol: 50.6 ml 24.93 ml/m   AORTIC VALVE  AV Area (Vmax):    1.06 cm  AV Area (Vmean):   1.06 cm  AV Area (VTI):     1.20 cm  AV Vmax:           403.00 cm/s  AV Vmean:          281.500 cm/s  AV VTI:            0.973 m  AV Peak Grad:      65.0 mmHg  AV Mean Grad:      38.0 mmHg  LVOT Vmax:         94.60 cm/s  LVOT Vmean:        65.700 cm/s  LVOT VTI:          0.258 m  LVOT/AV VTI ratio: 0.27  AI PHT:            200 msec     AORTA  Ao Root diam: 3.50 cm  Ao Asc diam:  3.40 cm   MITRAL VALVE                              SHUNTS                              Systemic VTI:  0.26 m  MV E velocity: 115.00 cm/s  Systemic Diam: 2.40 cm  MV A velocity: 36.90 cm/s  MV E/A ratio:  3.12   Cherlynn Kaiser MD  Electronically signed by Cherlynn Kaiser MD  Signature Date/Time: 04/15/2020/4:29:02 PM     Physicians   Panel Physicians Referring Physician Case Authorizing Physician  Burnell Blanks, MD (Primary)          Procedures   RIGHT/LEFT HEART CATH AND CORONARY ANGIOGRAPHY      Conclusion   1. No angiographic evidence of CAD 2. Normal filling pressures. 3. Moderate aortic stenosis (mean gradient 38.7 mmHg, peak to peak gradient 35 mmHg, AVA 1.0 cm2). Severe aortic insufficiency by echo.   Recommendations: He will need referral to CT surgery to discuss aortic valve replacement. Will discuss starting with CT scans prior to surgery evaluation.       Indications   Severe aortic insufficiency [I35.1 (ICD-10-CM)]      Procedural Details   Technical Details Indication: Severe aortic insufficiency/moderate aortic stenosis.   Procedure: The risks, benefits, complications, treatment options, and expected outcomes were discussed with the patient. The  patient and/or family concurred with the proposed plan, giving informed consent. The patient was brought to the cath lab after IV hydration was given. The patient was sedated with  Versed and Fentanyl. The right wrist was prepped and draped in a sterile fashion. 1% lidocaine was used for local anesthesia. Using the modified Seldinger access technique, a 5 French sheath was placed in the right radial artery. 3 mg Verapamil was given through the sheath. 4000 units IV heparin was given. Standard diagnostic catheters were used to perform selective coronary angiography. I crossed the aortic valve with the JR4 catheter and J wire. The sheath was removed from the right radial artery and a Terumo hemostasis band was applied at the arteriotomy site on the right wrist.    Estimated blood loss <50 mL.   During this procedure medications were administered to achieve and maintain moderate conscious sedation while the patient's heart rate, blood pressure, and oxygen saturation were continuously monitored and I was present face-to-face 100% of this time.      Medications (Filter: Administrations occurring from 0722 to 0819 on 06/27/20)  important  Continuous medications are totaled by the amount administered until 06/27/20 0819.      Heparin (Porcine) in NaCl 1000-0.9 UT/500ML-% SOLN (mL) Total volume:  1,000 mL  Date/Time Rate/Dose/Volume Action    06/27/20 0729 500 mL Given    0729 500 mL Given      fentaNYL (SUBLIMAZE) injection (mcg) Total dose:  50 mcg  Date/Time Rate/Dose/Volume Action    06/27/20 0734 25 mcg Given    0738 25 mcg Given      midazolam (VERSED) injection (mg) Total dose:  2 mg  Date/Time Rate/Dose/Volume Action    06/27/20 0734 2 mg Given      lidocaine (PF) (XYLOCAINE) 1 % injection (mL) Total volume:  4 mL  Date/Time Rate/Dose/Volume Action    06/27/20 0742 4 mL Given      Radial Cocktail/Verapamil only (mL) Total volume:  10 mL  Date/Time Rate/Dose/Volume Action     06/27/20 0746 10 mL Given      heparin sodium (porcine) injection (Units) Total dose:  4,000 Units  Date/Time Rate/Dose/Volume Action    06/27/20 0750 4,000 Units Given      iohexol (OMNIPAQUE) 350 MG/ML injection (mL) Total volume:  40 mL  Date/Time Rate/Dose/Volume Action    06/27/20 0810 40 mL Given        Sedation Time   Sedation Time Physician-1: 23 minutes 55 seconds       Radiation/Fluoro   Fluoro time: 3.3 (min) DAP: 7560 (mGycm2) Cumulative Air Kerma: 123XX123 (mGy)   Complications        Complications documented before study signed (06/27/2020  8:22 AM)         RIGHT/LEFT HEART CATH AND CORONARY ANGIOGRAPHY   None Documented by Burnell Blanks, MD 06/27/2020  8:03 AM  Date Found: 06/27/2020  Time Range: Intraprocedure            Coronary Findings     Diagnostic Dominance: Right   Left Anterior Descending  Vessel is large.  Left Circumflex  Vessel is large.  Right Coronary Artery  Vessel is large.    Intervention     No interventions have been documented.                   Coronary Diagrams     Diagnostic Dominance: Right      Intervention         Implants      No implant documentation for this case.      [employer]ng[employer] Images    Show images for CARDIAC CATHETERIZATION   Images  on Long Term Storage    Show images for Daundre, Huntsberger to Procedure Log   Procedure Log        Hemo Data   Flowsheet Row Most Recent Value  Fick Cardiac Output 6.99 L/min  Fick Cardiac Output Index 3.46 (L/min)/BSA  Aortic Mean Gradient 38.7 mmHg  Aortic Peak Gradient 35 mmHg  Aortic Valve Area 1.00  Aortic Value Area Index 0.49 cm2/BSA  RA A Wave 7 mmHg  RA V Wave 4 mmHg  RA Mean 4 mmHg  RV Systolic Pressure 30 mmHg  RV Diastolic Pressure 5 mmHg  RV EDP 7 mmHg  PA Systolic Pressure 33 mmHg  PA Diastolic Pressure 12 mmHg  PA Mean 23 mmHg  PW A Wave 15 mmHg  PW V Wave 14 mmHg  PW Mean 13 mmHg  AO Systolic  Pressure 123456 mmHg  AO Diastolic Pressure 60 mmHg  AO Mean 81 mmHg  LV Systolic Pressure 123456 mmHg  LV Diastolic Pressure 7 mmHg  LV EDP 24 mmHg  AOp Systolic Pressure 123456 mmHg  AOp Diastolic Pressure 58 mmHg  AOp Mean Pressure 81 mmHg  LVp Systolic Pressure 123456 mmHg  LVp Diastolic Pressure 7 mmHg  LVp EDP Pressure 33 mmHg  QP/QS 1  TPVR Index 6.65 HRUI  TSVR Index 23.41 HRUI  PVR SVR Ratio 0.13  TPVR/TSVR Ratio 0.28      Impression:   This 60 year old gentleman has stage D, severe, symptomatic aortic stenosis and aortic insufficiency with New York Heart Association class II symptoms of exertional fatigue and shortness of breath consistent with chronic diastolic congestive heart failure.  He is also having episodes of dizziness and lightheadedness with activity I have personally reviewed his 2D echocardiogram and cardiac catheterization studies.  His echo shows a severely thickened and calcified aortic valve with markedly restricted mobility.  He mean gradient and December 2021 was 52 mmHg but was reduced to 38 mmHg on his most recent echo in March 2022.  His ejection fraction is minimally decreased.  His AI pressure half-time has decreased further to 200 ms consistent with severe AI.  I agree that aortic valve replacement is indicated in this patient for relief of his symptoms and to prevent progressive left ventricular deterioration.  I reviewed his echo and cath films with him and his wife and answered their questions.  I discussed the alternatives of mechanical and bioprosthetic valves.  Given his age I think a bioprosthetic valve would be a good choice and avoid the need for long-term anticoagulation with Coumadin. I discussed the operative procedure with the patient and his wife including alternatives, benefits and risks; including but not limited to bleeding, blood transfusion, infection, stroke, myocardial infarction, graft failure, heart block requiring a permanent pacemaker, organ  dysfunction, and death.  Kirk Campbell understands and agrees to proceed.      Plan:   Aortic valve replacement using a bioprosthetic valve.   Gaye Pollack, MD [facility] [phone]

## 2020-09-01 ENCOUNTER — Inpatient Hospital Stay (HOSPITAL_COMMUNITY)
Admission: RE | Admit: 2020-09-01 | Discharge: 2020-09-06 | DRG: 220 | Disposition: A | Discharge status: Home / Self Care | Payer: 59 | Attending: Surgery | Admitting: Surgery

## 2020-09-01 ENCOUNTER — Inpatient Hospital Stay (HOSPITAL_COMMUNITY)
Admission: RE | Disposition: A | Discharge status: Home / Self Care | Payer: Self-pay | Source: Home / Self Care | Attending: Surgery

## 2020-09-01 ENCOUNTER — Inpatient Hospital Stay (HOSPITAL_COMMUNITY): Payer: 59

## 2020-09-01 ENCOUNTER — Encounter (HOSPITAL_COMMUNITY)
Admission: RE | Disposition: A | Discharge status: Home / Self Care | Payer: Self-pay | Source: Home / Self Care | Attending: Surgery

## 2020-09-01 ENCOUNTER — Encounter (HOSPITAL_COMMUNITY): Payer: Self-pay | Admitting: Surgery

## 2020-09-01 ENCOUNTER — Inpatient Hospital Stay (HOSPITAL_COMMUNITY): Payer: 59 | Admitting: Anesthesiology

## 2020-09-01 ENCOUNTER — Inpatient Hospital Stay (HOSPITAL_COMMUNITY): Payer: 59 | Admitting: Vascular Surgery

## 2020-09-01 DIAGNOSIS — I352 Nonrheumatic aortic (valve) stenosis with insufficiency: Secondary | ICD-10-CM | POA: Diagnosis present

## 2020-09-01 DIAGNOSIS — J939 Pneumothorax, unspecified: Secondary | ICD-10-CM

## 2020-09-01 DIAGNOSIS — T40605A Adverse effect of unspecified narcotics, initial encounter: Secondary | ICD-10-CM | POA: Diagnosis not present

## 2020-09-01 DIAGNOSIS — E8770 Fluid overload, unspecified: Secondary | ICD-10-CM | POA: Diagnosis not present

## 2020-09-01 DIAGNOSIS — Z79899 Other long term (current) drug therapy: Secondary | ICD-10-CM | POA: Diagnosis not present

## 2020-09-01 DIAGNOSIS — I351 Nonrheumatic aortic (valve) insufficiency: Secondary | ICD-10-CM | POA: Diagnosis present

## 2020-09-01 DIAGNOSIS — Z953 Presence of xenogenic heart valve: Secondary | ICD-10-CM

## 2020-09-01 DIAGNOSIS — R110 Nausea: Secondary | ICD-10-CM | POA: Diagnosis not present

## 2020-09-01 DIAGNOSIS — D62 Acute posthemorrhagic anemia: Secondary | ICD-10-CM | POA: Diagnosis not present

## 2020-09-01 DIAGNOSIS — Z981 Arthrodesis status: Secondary | ICD-10-CM | POA: Diagnosis not present

## 2020-09-01 DIAGNOSIS — R55 Syncope and collapse: Secondary | ICD-10-CM | POA: Diagnosis present

## 2020-09-01 DIAGNOSIS — Z951 Presence of aortocoronary bypass graft: Secondary | ICD-10-CM

## 2020-09-01 DIAGNOSIS — Z952 Presence of prosthetic heart valve: Secondary | ICD-10-CM

## 2020-09-01 DIAGNOSIS — Z7982 Long term (current) use of aspirin: Secondary | ICD-10-CM

## 2020-09-01 DIAGNOSIS — I9789 Other postprocedural complications and disorders of the circulatory system, not elsewhere classified: Secondary | ICD-10-CM

## 2020-09-01 DIAGNOSIS — D649 Anemia, unspecified: Secondary | ICD-10-CM | POA: Diagnosis not present

## 2020-09-01 DIAGNOSIS — I350 Nonrheumatic aortic (valve) stenosis: Secondary | ICD-10-CM | POA: Diagnosis not present

## 2020-09-01 HISTORY — PX: AORTIC VALVE REPLACEMENT: SHX41

## 2020-09-01 HISTORY — PX: TEE WITHOUT CARDIOVERSION: SHX5443

## 2020-09-01 HISTORY — PX: EXPLORATION POST OPERATIVE OPEN HEART: SHX5061

## 2020-09-01 LAB — CBC
HCT: 31.9 % — ABNORMAL LOW (ref 39.0–52.0)
HCT: 27.2 % — ABNORMAL LOW (ref 39.0–52.0)
HCT: 24.4 % — ABNORMAL LOW (ref 39.0–52.0)
Hemoglobin: 10.6 g/dL — ABNORMAL LOW (ref 13.0–17.0)
Hemoglobin: 8.9 g/dL — ABNORMAL LOW (ref 13.0–17.0)
Hemoglobin: 7.7 g/dL — ABNORMAL LOW (ref 13.0–17.0)
MCH: 31.3 pg (ref 26.0–34.0)
MCH: 30.7 pg (ref 26.0–34.0)
MCH: 30.1 pg (ref 26.0–34.0)
MCHC: 33.2 g/dL (ref 30.0–36.0)
MCHC: 32.7 g/dL (ref 30.0–36.0)
MCHC: 31.6 g/dL (ref 30.0–36.0)
MCV: 94.1 fL (ref 80.0–100.0)
MCV: 93.8 fL (ref 80.0–100.0)
MCV: 95.3 fL (ref 80.0–100.0)
Platelets: 150 10*3/uL (ref 150–400)
Platelets: 152 10*3/uL (ref 150–400)
Platelets: 145 10*3/uL — ABNORMAL LOW (ref 150–400)
RBC: 3.39 MIL/uL — ABNORMAL LOW (ref 4.22–5.81)
RBC: 2.9 MIL/uL — ABNORMAL LOW (ref 4.22–5.81)
RBC: 2.56 MIL/uL — ABNORMAL LOW (ref 4.22–5.81)
RDW: 13.5 % (ref 11.5–15.5)
RDW: 13.5 % (ref 11.5–15.5)
RDW: 13.6 % (ref 11.5–15.5)
WBC: 12.1 10*3/uL — ABNORMAL HIGH (ref 4.0–10.5)
WBC: 12.1 10*3/uL — ABNORMAL HIGH (ref 4.0–10.5)
WBC: 12.5 10*3/uL — ABNORMAL HIGH (ref 4.0–10.5)
nRBC: 0 % (ref 0.0–0.2)
nRBC: 0 % (ref 0.0–0.2)
nRBC: 0 % (ref 0.0–0.2)

## 2020-09-01 LAB — POCT I-STAT 7, (LYTES, BLD GAS, ICA,H+H)
Acid-Base Excess: 5 mmol/L — ABNORMAL HIGH (ref 0.0–2.0)
Acid-Base Excess: 1 mmol/L (ref 0.0–2.0)
Acid-Base Excess: 0 mmol/L (ref 0.0–2.0)
Acid-Base Excess: 0 mmol/L (ref 0.0–2.0)
Acid-Base Excess: 1 mmol/L (ref 0.0–2.0)
Acid-base deficit: 3 mmol/L — ABNORMAL HIGH (ref 0.0–2.0)
Acid-base deficit: 1 mmol/L (ref 0.0–2.0)
Bicarbonate: 29.5 mmol/L — ABNORMAL HIGH (ref 20.0–28.0)
Bicarbonate: 25.7 mmol/L (ref 20.0–28.0)
Bicarbonate: 24.9 mmol/L (ref 20.0–28.0)
Bicarbonate: 24.5 mmol/L (ref 20.0–28.0)
Bicarbonate: 25.6 mmol/L (ref 20.0–28.0)
Bicarbonate: 22.5 mmol/L (ref 20.0–28.0)
Bicarbonate: 24.7 mmol/L (ref 20.0–28.0)
Calcium, Ion: 1.02 mmol/L — ABNORMAL LOW (ref 1.15–1.40)
Calcium, Ion: 1.07 mmol/L — ABNORMAL LOW (ref 1.15–1.40)
Calcium, Ion: 1.07 mmol/L — ABNORMAL LOW (ref 1.15–1.40)
Calcium, Ion: 1.1 mmol/L — ABNORMAL LOW (ref 1.15–1.40)
Calcium, Ion: 1.1 mmol/L — ABNORMAL LOW (ref 1.15–1.40)
Calcium, Ion: 1.1 mmol/L — ABNORMAL LOW (ref 1.15–1.40)
Calcium, Ion: 1.16 mmol/L (ref 1.15–1.40)
HCT: 29 % — ABNORMAL LOW (ref 39.0–52.0)
HCT: 32 % — ABNORMAL LOW (ref 39.0–52.0)
HCT: 30 % — ABNORMAL LOW (ref 39.0–52.0)
HCT: 30 % — ABNORMAL LOW (ref 39.0–52.0)
HCT: 25 % — ABNORMAL LOW (ref 39.0–52.0)
HCT: 25 % — ABNORMAL LOW (ref 39.0–52.0)
HCT: 22 % — ABNORMAL LOW (ref 39.0–52.0)
Hemoglobin: 9.9 g/dL — ABNORMAL LOW (ref 13.0–17.0)
Hemoglobin: 10.9 g/dL — ABNORMAL LOW (ref 13.0–17.0)
Hemoglobin: 10.2 g/dL — ABNORMAL LOW (ref 13.0–17.0)
Hemoglobin: 10.2 g/dL — ABNORMAL LOW (ref 13.0–17.0)
Hemoglobin: 8.5 g/dL — ABNORMAL LOW (ref 13.0–17.0)
Hemoglobin: 8.5 g/dL — ABNORMAL LOW (ref 13.0–17.0)
Hemoglobin: 7.5 g/dL — ABNORMAL LOW (ref 13.0–17.0)
O2 Saturation: 100 %
O2 Saturation: 100 %
O2 Saturation: 100 %
O2 Saturation: 99 %
O2 Saturation: 100 %
O2 Saturation: 99 %
O2 Saturation: 100 %
Patient temperature: 35.9
Patient temperature: 35.4
Patient temperature: 37.4
Patient temperature: 37.4
Potassium: 4.8 mmol/L (ref 3.5–5.1)
Potassium: 4.6 mmol/L (ref 3.5–5.1)
Potassium: 4.2 mmol/L (ref 3.5–5.1)
Potassium: 3.6 mmol/L (ref 3.5–5.1)
Potassium: 4 mmol/L (ref 3.5–5.1)
Potassium: 3.7 mmol/L (ref 3.5–5.1)
Potassium: 3.9 mmol/L (ref 3.5–5.1)
Sodium: 139 mmol/L (ref 135–145)
Sodium: 139 mmol/L (ref 135–145)
Sodium: 141 mmol/L (ref 135–145)
Sodium: 143 mmol/L (ref 135–145)
Sodium: 143 mmol/L (ref 135–145)
Sodium: 144 mmol/L (ref 135–145)
Sodium: 141 mmol/L (ref 135–145)
TCO2: 31 mmol/L (ref 22–32)
TCO2: 27 mmol/L (ref 22–32)
TCO2: 26 mmol/L (ref 22–32)
TCO2: 26 mmol/L (ref 22–32)
TCO2: 27 mmol/L (ref 22–32)
TCO2: 24 mmol/L (ref 22–32)
TCO2: 26 mmol/L (ref 22–32)
pCO2 arterial: 43.8 mmHg (ref 32.0–48.0)
pCO2 arterial: 38.6 mmHg (ref 32.0–48.0)
pCO2 arterial: 40.1 mmHg (ref 32.0–48.0)
pCO2 arterial: 38.4 mmHg (ref 32.0–48.0)
pCO2 arterial: 38.5 mmHg (ref 32.0–48.0)
pCO2 arterial: 41.4 mmHg (ref 32.0–48.0)
pCO2 arterial: 46.4 mmHg (ref 32.0–48.0)
pH, Arterial: 7.436 (ref 7.350–7.450)
pH, Arterial: 7.431 (ref 7.350–7.450)
pH, Arterial: 7.401 (ref 7.350–7.450)
pH, Arterial: 7.407 (ref 7.350–7.450)
pH, Arterial: 7.425 (ref 7.350–7.450)
pH, Arterial: 7.345 — ABNORMAL LOW (ref 7.350–7.450)
pH, Arterial: 7.336 — ABNORMAL LOW (ref 7.350–7.450)
pO2, Arterial: 423 mmHg — ABNORMAL HIGH (ref 83.0–108.0)
pO2, Arterial: 230 mmHg — ABNORMAL HIGH (ref 83.0–108.0)
pO2, Arterial: 239 mmHg — ABNORMAL HIGH (ref 83.0–108.0)
pO2, Arterial: 110 mmHg — ABNORMAL HIGH (ref 83.0–108.0)
pO2, Arterial: 211 mmHg — ABNORMAL HIGH (ref 83.0–108.0)
pO2, Arterial: 129 mmHg — ABNORMAL HIGH (ref 83.0–108.0)
pO2, Arterial: 193 mmHg — ABNORMAL HIGH (ref 83.0–108.0)

## 2020-09-01 LAB — POCT I-STAT EG7
Acid-Base Excess: 2 mmol/L (ref 0.0–2.0)
Bicarbonate: 27.1 mmol/L (ref 20.0–28.0)
Calcium, Ion: 1.07 mmol/L — ABNORMAL LOW (ref 1.15–1.40)
HCT: 31 % — ABNORMAL LOW (ref 39.0–52.0)
Hemoglobin: 10.5 g/dL — ABNORMAL LOW (ref 13.0–17.0)
O2 Saturation: 77 %
Potassium: 4.3 mmol/L (ref 3.5–5.1)
Sodium: 140 mmol/L (ref 135–145)
TCO2: 28 mmol/L (ref 22–32)
pCO2, Ven: 43.5 mmHg — ABNORMAL LOW (ref 44.0–60.0)
pH, Ven: 7.402 (ref 7.250–7.430)
pO2, Ven: 42 mmHg (ref 32.0–45.0)

## 2020-09-01 LAB — POCT I-STAT, CHEM 8
BUN: 16 mg/dL (ref 6–20)
BUN: 15 mg/dL (ref 6–20)
BUN: 15 mg/dL (ref 6–20)
BUN: 13 mg/dL (ref 6–20)
BUN: 14 mg/dL (ref 6–20)
Calcium, Ion: 1.3 mmol/L (ref 1.15–1.40)
Calcium, Ion: 1.2 mmol/L (ref 1.15–1.40)
Calcium, Ion: 1.16 mmol/L (ref 1.15–1.40)
Calcium, Ion: 1.07 mmol/L — ABNORMAL LOW (ref 1.15–1.40)
Calcium, Ion: 1.07 mmol/L — ABNORMAL LOW (ref 1.15–1.40)
Chloride: 103 mmol/L (ref 98–111)
Chloride: 103 mmol/L (ref 98–111)
Chloride: 104 mmol/L (ref 98–111)
Chloride: 102 mmol/L (ref 98–111)
Chloride: 103 mmol/L (ref 98–111)
Creatinine, Ser: 0.7 mg/dL (ref 0.61–1.24)
Creatinine, Ser: 0.7 mg/dL (ref 0.61–1.24)
Creatinine, Ser: 0.7 mg/dL (ref 0.61–1.24)
Creatinine, Ser: 0.6 mg/dL — ABNORMAL LOW (ref 0.61–1.24)
Creatinine, Ser: 0.7 mg/dL (ref 0.61–1.24)
Glucose, Bld: 107 mg/dL — ABNORMAL HIGH (ref 70–99)
Glucose, Bld: 131 mg/dL — ABNORMAL HIGH (ref 70–99)
Glucose, Bld: 114 mg/dL — ABNORMAL HIGH (ref 70–99)
Glucose, Bld: 115 mg/dL — ABNORMAL HIGH (ref 70–99)
Glucose, Bld: 136 mg/dL — ABNORMAL HIGH (ref 70–99)
HCT: 41 % (ref 39.0–52.0)
HCT: 36 % — ABNORMAL LOW (ref 39.0–52.0)
HCT: 33 % — ABNORMAL LOW (ref 39.0–52.0)
HCT: 27 % — ABNORMAL LOW (ref 39.0–52.0)
HCT: 29 % — ABNORMAL LOW (ref 39.0–52.0)
Hemoglobin: 13.9 g/dL (ref 13.0–17.0)
Hemoglobin: 12.2 g/dL — ABNORMAL LOW (ref 13.0–17.0)
Hemoglobin: 11.2 g/dL — ABNORMAL LOW (ref 13.0–17.0)
Hemoglobin: 9.2 g/dL — ABNORMAL LOW (ref 13.0–17.0)
Hemoglobin: 9.9 g/dL — ABNORMAL LOW (ref 13.0–17.0)
Potassium: 4 mmol/L (ref 3.5–5.1)
Potassium: 4 mmol/L (ref 3.5–5.1)
Potassium: 4.7 mmol/L (ref 3.5–5.1)
Potassium: 4.1 mmol/L (ref 3.5–5.1)
Potassium: 4.9 mmol/L (ref 3.5–5.1)
Sodium: 141 mmol/L (ref 135–145)
Sodium: 139 mmol/L (ref 135–145)
Sodium: 140 mmol/L (ref 135–145)
Sodium: 138 mmol/L (ref 135–145)
Sodium: 140 mmol/L (ref 135–145)
TCO2: 30 mmol/L (ref 22–32)
TCO2: 27 mmol/L (ref 22–32)
TCO2: 28 mmol/L (ref 22–32)
TCO2: 25 mmol/L (ref 22–32)
TCO2: 28 mmol/L (ref 22–32)

## 2020-09-01 LAB — ECHO INTRAOPERATIVE TEE
Height: 70 in
Weight: 2960 oz

## 2020-09-01 LAB — FIBRINOGEN: Fibrinogen: 155 mg/dL — ABNORMAL LOW (ref 210–475)

## 2020-09-01 LAB — GLUCOSE, CAPILLARY
Glucose-Capillary: 113 mg/dL — ABNORMAL HIGH (ref 70–99)
Glucose-Capillary: 140 mg/dL — ABNORMAL HIGH (ref 70–99)
Glucose-Capillary: 131 mg/dL — ABNORMAL HIGH (ref 70–99)
Glucose-Capillary: 130 mg/dL — ABNORMAL HIGH (ref 70–99)
Glucose-Capillary: 132 mg/dL — ABNORMAL HIGH (ref 70–99)
Glucose-Capillary: 134 mg/dL — ABNORMAL HIGH (ref 70–99)

## 2020-09-01 LAB — BASIC METABOLIC PANEL
Anion gap: 6 (ref 5–15)
BUN: 13 mg/dL (ref 6–20)
CO2: 24 mmol/L (ref 22–32)
Calcium: 7.4 mg/dL — ABNORMAL LOW (ref 8.9–10.3)
Chloride: 109 mmol/L (ref 98–111)
Creatinine, Ser: 0.71 mg/dL (ref 0.61–1.24)
GFR, Estimated: >60 mL/min (ref >60)
Glucose, Bld: 171 mg/dL — ABNORMAL HIGH (ref 70–99)
Potassium: 3.9 mmol/L (ref 3.5–5.1)
Sodium: 139 mmol/L (ref 135–145)

## 2020-09-01 LAB — PLATELET COUNT: Platelets: 159 10*3/uL (ref 150–400)

## 2020-09-01 LAB — ABO/RH: ABO/RH(D): B NEG

## 2020-09-01 LAB — APTT: aPTT: 40 seconds — ABNORMAL HIGH (ref 24–36)

## 2020-09-01 LAB — HEMOGLOBIN AND HEMATOCRIT, BLOOD
HCT: 33.9 % — ABNORMAL LOW (ref 39.0–52.0)
Hemoglobin: 11.1 g/dL — ABNORMAL LOW (ref 13.0–17.0)

## 2020-09-01 LAB — MAGNESIUM: Magnesium: 2.2 mg/dL (ref 1.7–2.4)

## 2020-09-01 LAB — PROTIME-INR
INR: 1.5 — ABNORMAL HIGH (ref 0.8–1.2)
Prothrombin Time: 18.1 seconds — ABNORMAL HIGH (ref 11.4–15.2)

## 2020-09-01 SURGERY — EXPLORATION POST OPERATIVE OPEN HEART
Anesthesia: General | Site: Chest

## 2020-09-01 SURGERY — REPLACEMENT, AORTIC VALVE, OPEN
Anesthesia: General | Site: Chest

## 2020-09-01 MED ORDER — ASPIRIN EC 325 MG PO TBEC: 325.0000 mg | DELAYED_RELEASE_TABLET | Freq: Every day | ORAL | Status: DC

## 2020-09-01 MED ORDER — LACTATED RINGERS IV SOLN: INTRAVENOUS | Status: DC

## 2020-09-01 MED ORDER — PROPOFOL 500 MG/50ML IV EMUL
INTRAVENOUS | Status: DC | PRN
  Administered 2020-09-01: 30 mg via INTRAVENOUS

## 2020-09-01 MED ORDER — DOCUSATE SODIUM 100 MG PO CAPS
200.0000 mg | ORAL_CAPSULE | Freq: Every day | ORAL | Status: DC
  Administered 2020-09-02: 200 mg via ORAL
  Filled 2020-09-01 (×2): qty 2

## 2020-09-01 MED ORDER — FENTANYL CITRATE (PF) 250 MCG/5ML IJ SOLN
INTRAMUSCULAR | Status: AC
  Filled 2020-09-01: qty 25

## 2020-09-01 MED ORDER — ~~LOC~~ CARDIAC SURGERY, PATIENT & FAMILY EDUCATION
Freq: Once | Status: DC
  Filled 2020-09-01: qty 1

## 2020-09-01 MED ORDER — CHLORHEXIDINE GLUCONATE 0.12 % MT SOLN
15.0000 mL | Freq: Once | OROMUCOSAL | Status: AC
  Administered 2020-09-01: 15 mL via OROMUCOSAL
  Filled 2020-09-01: qty 15

## 2020-09-01 MED ORDER — SODIUM CHLORIDE 0.9 % IV SOLN: 250.0000 mL | INTRAVENOUS | Status: DC

## 2020-09-01 MED ORDER — PHENYLEPHRINE 40 MCG/ML (10ML) SYRINGE FOR IV PUSH (FOR BLOOD PRESSURE SUPPORT)
PREFILLED_SYRINGE | INTRAVENOUS | Status: DC | PRN
  Administered 2020-09-01: 80 ug via INTRAVENOUS

## 2020-09-01 MED ORDER — SODIUM CHLORIDE (PF) 0.9 % IJ SOLN
OROMUCOSAL | Status: DC | PRN
  Administered 2020-09-01 (×3): 4 mL via TOPICAL

## 2020-09-01 MED ORDER — ALBUMIN HUMAN 5 % IV SOLN: INTRAVENOUS | Status: DC | PRN

## 2020-09-01 MED ORDER — THROMBIN 20000 UNITS EX SOLR
CUTANEOUS | Status: DC | PRN
  Administered 2020-09-01: 20000 [IU] via TOPICAL

## 2020-09-01 MED ORDER — DEXMEDETOMIDINE HCL IN NACL 400 MCG/100ML IV SOLN
0.0000 ug/kg/h | INTRAVENOUS | Status: DC
  Filled 2020-09-01: qty 100

## 2020-09-01 MED ORDER — PHENYLEPHRINE HCL-NACL 20-0.9 MG/250ML-% IV SOLN
0.0000 ug/min | INTRAVENOUS | Status: DC
  Administered 2020-09-01: 30 ug/min via INTRAVENOUS

## 2020-09-01 MED ORDER — METOPROLOL TARTRATE 25 MG/10 ML ORAL SUSPENSION: 12.5000 mg | Freq: Two times a day (BID) | ORAL | Status: DC

## 2020-09-01 MED ORDER — HEMOSTATIC AGENTS (NO CHARGE) OPTIME
TOPICAL | Status: DC | PRN
  Administered 2020-09-01: 1 via TOPICAL

## 2020-09-01 MED ORDER — CHLORHEXIDINE GLUCONATE CLOTH 2 % EX PADS
6.0000 | MEDICATED_PAD | Freq: Every day | CUTANEOUS | Status: DC
  Administered 2020-09-01 – 2020-09-02 (×2): 6 via TOPICAL

## 2020-09-01 MED ORDER — ORAL CARE MOUTH RINSE
15.0000 mL | Freq: Two times a day (BID) | OROMUCOSAL | Status: DC
  Administered 2020-09-01 – 2020-09-05 (×6): 15 mL via OROMUCOSAL

## 2020-09-01 MED ORDER — ROCURONIUM BROMIDE 10 MG/ML (PF) SYRINGE
PREFILLED_SYRINGE | INTRAVENOUS | Status: DC | PRN
  Administered 2020-09-01: 50 mg via INTRAVENOUS
  Administered 2020-09-01: 20 mg via INTRAVENOUS
  Administered 2020-09-01: 50 mg via INTRAVENOUS
  Administered 2020-09-01: 100 mg via INTRAVENOUS

## 2020-09-01 MED ORDER — SODIUM CHLORIDE 0.9% FLUSH: 10.0000 mL | INTRAVENOUS | Status: DC | PRN

## 2020-09-01 MED ORDER — CHLORHEXIDINE GLUCONATE 4 % EX LIQD: 30.0000 mL | CUTANEOUS | Status: DC

## 2020-09-01 MED ORDER — PHENYLEPHRINE 40 MCG/ML (10ML) SYRINGE FOR IV PUSH (FOR BLOOD PRESSURE SUPPORT)
PREFILLED_SYRINGE | INTRAVENOUS | Status: AC
  Filled 2020-09-01: qty 10

## 2020-09-01 MED ORDER — ROCURONIUM BROMIDE 10 MG/ML (PF) SYRINGE
PREFILLED_SYRINGE | INTRAVENOUS | Status: DC | PRN
  Administered 2020-09-01: 50 mg via INTRAVENOUS

## 2020-09-01 MED ORDER — SODIUM CHLORIDE 0.9 % IV SOLN: INTRAVENOUS | Status: DC

## 2020-09-01 MED ORDER — PROPOFOL 10 MG/ML IV BOLUS
INTRAVENOUS | Status: AC
  Filled 2020-09-01: qty 20

## 2020-09-01 MED ORDER — CEFAZOLIN SODIUM-DEXTROSE 2-4 GM/100ML-% IV SOLN
2.0000 g | Freq: Three times a day (TID) | INTRAVENOUS | Status: DC
  Administered 2020-09-01 – 2020-09-03 (×5): 2 g via INTRAVENOUS
  Filled 2020-09-01 (×6): qty 100

## 2020-09-01 MED ORDER — NITROGLYCERIN IN D5W 200-5 MCG/ML-% IV SOLN: 0.0000 ug/min | INTRAVENOUS | Status: DC

## 2020-09-01 MED ORDER — VANCOMYCIN HCL IN DEXTROSE 1-5 GM/200ML-% IV SOLN
1000.0000 mg | Freq: Once | INTRAVENOUS | Status: AC
  Administered 2020-09-01: 1000 mg via INTRAVENOUS
  Filled 2020-09-01: qty 200

## 2020-09-01 MED ORDER — PROTAMINE SULFATE 10 MG/ML IV SOLN
INTRAVENOUS | Status: DC | PRN
  Administered 2020-09-01: 260 mg via INTRAVENOUS
  Administered 2020-09-01: 30 mg via INTRAVENOUS

## 2020-09-01 MED ORDER — PROTAMINE SULFATE 10 MG/ML IV SOLN
INTRAVENOUS | Status: AC
  Filled 2020-09-01: qty 25

## 2020-09-01 MED ORDER — ACETAMINOPHEN 160 MG/5ML PO SOLN
1000.0000 mg | Freq: Four times a day (QID) | ORAL | Status: DC
  Administered 2020-09-02: 1000 mg
  Filled 2020-09-01: qty 40.6

## 2020-09-01 MED ORDER — PANTOPRAZOLE SODIUM 40 MG PO TBEC
40.0000 mg | DELAYED_RELEASE_TABLET | Freq: Every day | ORAL | Status: DC
  Filled 2020-09-01: qty 1

## 2020-09-01 MED ORDER — INSULIN REGULAR(HUMAN) IN NACL 100-0.9 UT/100ML-% IV SOLN
INTRAVENOUS | Status: DC
  Administered 2020-09-01: .7 [IU]/h via INTRAVENOUS

## 2020-09-01 MED ORDER — LACTATED RINGERS IV SOLN: 500.0000 mL | Freq: Once | INTRAVENOUS | Status: DC | PRN

## 2020-09-01 MED ORDER — PHENYLEPHRINE 40 MCG/ML (10ML) SYRINGE FOR IV PUSH (FOR BLOOD PRESSURE SUPPORT)
PREFILLED_SYRINGE | INTRAVENOUS | Status: DC | PRN
  Administered 2020-09-01: 40 ug via INTRAVENOUS

## 2020-09-01 MED ORDER — FENTANYL CITRATE (PF) 100 MCG/2ML IJ SOLN
INTRAMUSCULAR | Status: DC | PRN
  Administered 2020-09-01 (×2): 50 ug via INTRAVENOUS

## 2020-09-01 MED ORDER — SODIUM CHLORIDE 0.9% FLUSH
10.0000 mL | Freq: Two times a day (BID) | INTRAVENOUS | Status: DC
  Administered 2020-09-01 – 2020-09-02 (×3): 10 mL

## 2020-09-01 MED ORDER — PROTAMINE SULFATE 10 MG/ML IV SOLN
INTRAVENOUS | Status: AC
  Filled 2020-09-01: qty 5

## 2020-09-01 MED ORDER — FAMOTIDINE IN NACL 20-0.9 MG/50ML-% IV SOLN
20.0000 mg | Freq: Two times a day (BID) | INTRAVENOUS | Status: AC
Start: 2020-09-01 — End: 2020-09-02
  Administered 2020-09-01: 20 mg via INTRAVENOUS
  Filled 2020-09-01: qty 50

## 2020-09-01 MED ORDER — BISACODYL 5 MG PO TBEC
10.0000 mg | DELAYED_RELEASE_TABLET | Freq: Every day | ORAL | Status: DC
  Administered 2020-09-02: 10 mg via ORAL
  Filled 2020-09-01 (×2): qty 2

## 2020-09-01 MED ORDER — MORPHINE SULFATE (PF) 2 MG/ML IV SOLN
1.0000 mg | INTRAVENOUS | Status: DC | PRN
  Administered 2020-09-01: 4 mg via INTRAVENOUS
  Administered 2020-09-01 (×2): 2 mg via INTRAVENOUS
  Filled 2020-09-01 (×2): qty 1
  Filled 2020-09-01: qty 2

## 2020-09-01 MED ORDER — METOPROLOL TARTRATE 12.5 MG HALF TABLET
12.5000 mg | ORAL_TABLET | Freq: Two times a day (BID) | ORAL | Status: DC
  Administered 2020-09-02 – 2020-09-03 (×2): 12.5 mg via ORAL
  Filled 2020-09-01 (×2): qty 1

## 2020-09-01 MED ORDER — CALCIUM CHLORIDE 10 % IV SOLN
INTRAVENOUS | Status: DC | PRN
  Administered 2020-09-01: 100 mg via INTRAVENOUS

## 2020-09-01 MED ORDER — BISACODYL 10 MG RE SUPP: 10.0000 mg | Freq: Every day | RECTAL | Status: DC

## 2020-09-01 MED ORDER — OXYCODONE HCL 5 MG PO TABS
5.0000 mg | ORAL_TABLET | ORAL | Status: DC | PRN
  Administered 2020-09-02: 10 mg via ORAL
  Administered 2020-09-02: 5 mg via ORAL
  Filled 2020-09-01: qty 2
  Filled 2020-09-01: qty 1

## 2020-09-01 MED ORDER — HEPARIN SODIUM (PORCINE) 1000 UNIT/ML IJ SOLN
INTRAMUSCULAR | Status: AC
  Filled 2020-09-01: qty 1

## 2020-09-01 MED ORDER — TRAMADOL HCL 50 MG PO TABS
50.0000 mg | ORAL_TABLET | ORAL | Status: DC | PRN
  Administered 2020-09-02: 100 mg via ORAL
  Filled 2020-09-01: qty 2

## 2020-09-01 MED ORDER — SODIUM CHLORIDE 0.45 % IV SOLN: INTRAVENOUS | Status: DC | PRN

## 2020-09-01 MED ORDER — HEPARIN SODIUM (PORCINE) 1000 UNIT/ML IJ SOLN
INTRAMUSCULAR | Status: DC | PRN
  Administered 2020-09-01: 29000 [IU] via INTRAVENOUS

## 2020-09-01 MED ORDER — MIDAZOLAM HCL (PF) 5 MG/ML IJ SOLN
INTRAMUSCULAR | Status: DC | PRN
  Administered 2020-09-01: 3 mg via INTRAVENOUS
  Administered 2020-09-01: 2 mg via INTRAVENOUS
  Administered 2020-09-01: 3 mg via INTRAVENOUS
  Administered 2020-09-01: 2 mg via INTRAVENOUS

## 2020-09-01 MED ORDER — CHLORHEXIDINE GLUCONATE CLOTH 2 % EX PADS: 6.0000 | MEDICATED_PAD | Freq: Every day | CUTANEOUS | Status: DC

## 2020-09-01 MED ORDER — ARTIFICIAL TEARS OPHTHALMIC OINT
TOPICAL_OINTMENT | OPHTHALMIC | Status: DC | PRN
  Administered 2020-09-01: 1 via OPHTHALMIC

## 2020-09-01 MED ORDER — METOPROLOL TARTRATE 5 MG/5ML IV SOLN: 2.5000 mg | INTRAVENOUS | Status: DC | PRN

## 2020-09-01 MED ORDER — DEXTROSE 50 % IV SOLN: 0.0000 mL | INTRAVENOUS | Status: DC | PRN

## 2020-09-01 MED ORDER — ACETAMINOPHEN 500 MG PO TABS
1000.0000 mg | ORAL_TABLET | Freq: Four times a day (QID) | ORAL | Status: DC
  Administered 2020-09-02 – 2020-09-03 (×5): 1000 mg via ORAL
  Filled 2020-09-01 (×5): qty 2

## 2020-09-01 MED ORDER — LACTATED RINGERS IV SOLN: INTRAVENOUS | Status: DC | PRN

## 2020-09-01 MED ORDER — SODIUM CHLORIDE 0.9% FLUSH: 3.0000 mL | INTRAVENOUS | Status: DC | PRN

## 2020-09-01 MED ORDER — ACETAMINOPHEN 160 MG/5ML PO SOLN: 650.0000 mg | Freq: Once | ORAL | Status: AC

## 2020-09-01 MED ORDER — CHLORHEXIDINE GLUCONATE 0.12 % MT SOLN
15.0000 mL | OROMUCOSAL | Status: AC
  Administered 2020-09-01: 15 mL via OROMUCOSAL

## 2020-09-01 MED ORDER — FENTANYL CITRATE (PF) 100 MCG/2ML IJ SOLN
INTRAMUSCULAR | Status: DC | PRN
  Administered 2020-09-01: 100 ug via INTRAVENOUS
  Administered 2020-09-01: 150 ug via INTRAVENOUS
  Administered 2020-09-01: 50 ug via INTRAVENOUS
  Administered 2020-09-01 (×2): 100 ug via INTRAVENOUS
  Administered 2020-09-01: 150 ug via INTRAVENOUS
  Administered 2020-09-01: 350 ug via INTRAVENOUS
  Administered 2020-09-01: 150 ug via INTRAVENOUS
  Administered 2020-09-01: 100 ug via INTRAVENOUS

## 2020-09-01 MED ORDER — SODIUM CHLORIDE (PF) 0.9 % IJ SOLN
OROMUCOSAL | Status: DC | PRN
  Administered 2020-09-01 (×2): 4 mL via TOPICAL

## 2020-09-01 MED ORDER — MIDAZOLAM HCL (PF) 10 MG/2ML IJ SOLN
INTRAMUSCULAR | Status: AC
  Filled 2020-09-01: qty 2

## 2020-09-01 MED ORDER — THROMBIN (RECOMBINANT) 20000 UNITS EX SOLR
CUTANEOUS | Status: AC
  Filled 2020-09-01: qty 20000

## 2020-09-01 MED ORDER — 0.9 % SODIUM CHLORIDE (POUR BTL) OPTIME
TOPICAL | Status: DC | PRN
  Administered 2020-09-01: 3000 mL

## 2020-09-01 MED ORDER — ROCURONIUM BROMIDE 10 MG/ML (PF) SYRINGE
PREFILLED_SYRINGE | INTRAVENOUS | Status: AC
  Filled 2020-09-01: qty 10

## 2020-09-01 MED ORDER — ACETAMINOPHEN 650 MG RE SUPP
650.0000 mg | Freq: Once | RECTAL | Status: AC
  Administered 2020-09-01: 650 mg via RECTAL

## 2020-09-01 MED ORDER — PROPOFOL 10 MG/ML IV BOLUS
INTRAVENOUS | Status: DC | PRN
  Administered 2020-09-01: 20 mg via INTRAVENOUS
  Administered 2020-09-01: 50 mg via INTRAVENOUS

## 2020-09-01 MED ORDER — ASPIRIN 81 MG PO CHEW: 324.0000 mg | CHEWABLE_TABLET | Freq: Every day | ORAL | Status: DC

## 2020-09-01 MED ORDER — POTASSIUM CHLORIDE 10 MEQ/50ML IV SOLN: 10.0000 meq | INTRAVENOUS | Status: AC

## 2020-09-01 MED ORDER — ORAL CARE MOUTH RINSE: 15.0000 mL | Freq: Once | OROMUCOSAL | Status: DC

## 2020-09-01 MED ORDER — FENTANYL CITRATE (PF) 250 MCG/5ML IJ SOLN
INTRAMUSCULAR | Status: AC
  Filled 2020-09-01: qty 5

## 2020-09-01 MED ORDER — TRANEXAMIC ACID 1000 MG/10ML IV SOLN
INTRAVENOUS | Status: DC | PRN
  Administered 2020-09-01: 1.5 mg/kg/h via INTRAVENOUS

## 2020-09-01 MED ORDER — CHLORHEXIDINE GLUCONATE 0.12 % MT SOLN: 15.0000 mL | Freq: Once | OROMUCOSAL | Status: DC

## 2020-09-01 MED ORDER — ALBUMIN HUMAN 5 % IV SOLN
250.0000 mL | INTRAVENOUS | Status: DC | PRN
  Administered 2020-09-01 (×3): 12.5 g via INTRAVENOUS
  Filled 2020-09-01: qty 250

## 2020-09-01 MED ORDER — MIDAZOLAM HCL 2 MG/2ML IJ SOLN
2.0000 mg | INTRAMUSCULAR | Status: DC | PRN
  Administered 2020-09-01: 2 mg via INTRAVENOUS
  Filled 2020-09-01: qty 2

## 2020-09-01 MED ORDER — MAGNESIUM SULFATE 4 GM/100ML IV SOLN
4.0000 g | Freq: Once | INTRAVENOUS | Status: AC
  Administered 2020-09-01: 4 g via INTRAVENOUS
  Filled 2020-09-01: qty 100

## 2020-09-01 MED ORDER — SODIUM CHLORIDE 0.9% FLUSH
3.0000 mL | Freq: Two times a day (BID) | INTRAVENOUS | Status: DC
  Administered 2020-09-02 (×2): 3 mL via INTRAVENOUS

## 2020-09-01 MED ORDER — ONDANSETRON HCL 4 MG/2ML IJ SOLN
4.0000 mg | Freq: Four times a day (QID) | INTRAMUSCULAR | Status: DC | PRN
  Administered 2020-09-01 – 2020-09-02 (×4): 4 mg via INTRAVENOUS
  Filled 2020-09-01 (×4): qty 2

## 2020-09-01 MED ORDER — 0.9 % SODIUM CHLORIDE (POUR BTL) OPTIME
TOPICAL | Status: DC | PRN
  Administered 2020-09-01: 1000 mL
  Administered 2020-09-01: 4000 mL
  Administered 2020-09-01: 1000 mL

## 2020-09-01 MED ORDER — METOPROLOL TARTRATE 12.5 MG HALF TABLET
12.5000 mg | ORAL_TABLET | Freq: Once | ORAL | Status: AC
  Administered 2020-09-01: 12.5 mg via ORAL
  Filled 2020-09-01: qty 1

## 2020-09-01 MED ORDER — SODIUM CHLORIDE 0.9 % IV SOLN: INTRAVENOUS | Status: DC | PRN

## 2020-09-01 MED ORDER — LIDOCAINE 2% (20 MG/ML) 5 ML SYRINGE
INTRAMUSCULAR | Status: AC
  Filled 2020-09-01: qty 5

## 2020-09-01 SURGICAL SUPPLY — 70 items
BLADE CLIPPER SURG (BLADE) ×2 IMPLANT
CANISTER SUCT 3000ML PPV (MISCELLANEOUS) ×2 IMPLANT
CATH ROBINSON RED A/P 18FR (CATHETERS) IMPLANT
COVER SURGICAL LIGHT HANDLE (MISCELLANEOUS) ×4 IMPLANT
DRAPE CARDIOVASCULAR INCISE (DRAPES) ×2
DRAPE SRG 135X102X78XABS (DRAPES) ×1 IMPLANT
DRAPE WARM FLUID 44X44 (DRAPES) IMPLANT
DRSG COVADERM 4X14 (GAUZE/BANDAGES/DRESSINGS) ×2 IMPLANT
ELECT BLADE 4.0 EZ CLEAN MEGAD (MISCELLANEOUS) ×2
ELECT CAUTERY BLADE 6.4 (BLADE) ×2 IMPLANT
ELECT REM PT RETURN 9FT ADLT (ELECTROSURGICAL) ×4
ELECTRODE BLDE 4.0 EZ CLN MEGD (MISCELLANEOUS) ×1 IMPLANT
ELECTRODE REM PT RTRN 9FT ADLT (ELECTROSURGICAL) ×2 IMPLANT
GAUZE SPONGE 4X4 12PLY STRL (GAUZE/BANDAGES/DRESSINGS) ×2 IMPLANT
GLOVE SURG ENC MOIS LTX SZ6 (GLOVE) IMPLANT
GLOVE SURG ENC MOIS LTX SZ6.5 (GLOVE) IMPLANT
GLOVE SURG ENC MOIS LTX SZ7 (GLOVE) IMPLANT
GLOVE SURG ENC MOIS LTX SZ7.5 (GLOVE) IMPLANT
GLOVE SURG MICRO LTX SZ6 (GLOVE) ×6 IMPLANT
GLOVE SURG MICRO LTX SZ7 (GLOVE) ×4 IMPLANT
GLOVE SURG UNDER POLY LF SZ7 (GLOVE) IMPLANT
GOWN STRL REUS W/ TWL LRG LVL3 (GOWN DISPOSABLE) ×4 IMPLANT
GOWN STRL REUS W/ TWL XL LVL3 (GOWN DISPOSABLE) ×1 IMPLANT
GOWN STRL REUS W/TWL LRG LVL3 (GOWN DISPOSABLE) ×8
GOWN STRL REUS W/TWL XL LVL3 (GOWN DISPOSABLE) ×2
HEMOSTAT POWDER SURGIFOAM 1G (HEMOSTASIS) ×4 IMPLANT
HEMOSTAT SURGICEL 2X14 (HEMOSTASIS) ×2 IMPLANT
KIT BASIN OR (CUSTOM PROCEDURE TRAY) ×2 IMPLANT
KIT CATH CPB BARTLE (MISCELLANEOUS) IMPLANT
KIT SUCTION CATH 14FR (SUCTIONS) ×2 IMPLANT
KIT TURNOVER KIT B (KITS) ×2 IMPLANT
NS IRRIG 1000ML POUR BTL (IV SOLUTION) ×6 IMPLANT
PACK CHEST (CUSTOM PROCEDURE TRAY) ×2 IMPLANT
PACK E OPEN HEART (SUTURE) ×2 IMPLANT
PAD ARMBOARD 7.5X6 YLW CONV (MISCELLANEOUS) ×4 IMPLANT
PAD ELECT DEFIB RADIOL ZOLL (MISCELLANEOUS) ×2 IMPLANT
PENCIL BUTTON HOLSTER BLD 10FT (ELECTRODE) IMPLANT
POSITIONER HEAD DONUT 9IN (MISCELLANEOUS) ×2 IMPLANT
SPONGE T-LAP 18X18 ~~LOC~~+RFID (SPONGE) ×8 IMPLANT
SPONGE T-LAP 4X18 ~~LOC~~+RFID (SPONGE) IMPLANT
SUT ETHIBOND 2 0 SH (SUTURE) ×2
SUT ETHIBOND 2 0 SH 36X2 (SUTURE) ×1 IMPLANT
SUT MNCRL AB 4-0 PS2 18 (SUTURE) IMPLANT
SUT PROLENE 3 0 SH1 36 (SUTURE) ×2 IMPLANT
SUT PROLENE 4 0 RB 1 (SUTURE) ×4
SUT PROLENE 4-0 RB1 .5 CRCL 36 (SUTURE) ×2 IMPLANT
SUT PROLENE 5 0 C 1 36 (SUTURE) ×4 IMPLANT
SUT PROLENE 6 0 C 1 30 (SUTURE) IMPLANT
SUT PROLENE 7 0 BV 1 (SUTURE) IMPLANT
SUT PROLENE 7 0 BV1 MDA (SUTURE) IMPLANT
SUT PROLENE 8 0 BV175 6 (SUTURE) IMPLANT
SUT SILK 1 TIES 10X30 (SUTURE) ×2 IMPLANT
SUT SILK 2 0 SH CR/8 (SUTURE) ×2 IMPLANT
SUT SILK 3 0 SH CR/8 (SUTURE) ×2 IMPLANT
SUT STEEL STERNAL CCS#1 18IN (SUTURE) ×2 IMPLANT
SUT STEEL SZ 6 DBL 3X14 BALL (SUTURE) ×4 IMPLANT
SUT VIC AB 1 CTX 36 (SUTURE) ×10
SUT VIC AB 1 CTX36XBRD ANBCTR (SUTURE) ×5 IMPLANT
SUT VIC AB 2-0 CTX 36 (SUTURE) ×4 IMPLANT
SUT VIC AB 3-0 SH 27 (SUTURE)
SUT VIC AB 3-0 SH 27X BRD (SUTURE) IMPLANT
SUT VIC AB 3-0 X1 27 (SUTURE) ×4 IMPLANT
SUT VICRYL 4-0 PS2 18IN ABS (SUTURE) IMPLANT
SYSTEM SAHARA CHEST DRAIN ATS (WOUND CARE) ×2 IMPLANT
TAPE CLOTH 4X10 WHT NS (GAUZE/BANDAGES/DRESSINGS) ×2 IMPLANT
TAPE PAPER 2X10 WHT MICROPORE (GAUZE/BANDAGES/DRESSINGS) ×2 IMPLANT
TOWEL GREEN STERILE (TOWEL DISPOSABLE) ×2 IMPLANT
TOWEL GREEN STERILE FF (TOWEL DISPOSABLE) ×2 IMPLANT
UNDERPAD 30X36 HEAVY ABSORB (UNDERPADS AND DIAPERS) ×2 IMPLANT
WATER STERILE IRR 1000ML POUR (IV SOLUTION) ×4 IMPLANT

## 2020-09-01 SURGICAL SUPPLY — 74 items
ADAPTER CARDIO PERF ANTE/RETRO (ADAPTER) ×3 IMPLANT
ADPR PRFSN 84XANTGRD RTRGD (ADAPTER) ×2
BAG DECANTER FOR FLEXI CONT (MISCELLANEOUS) ×3 IMPLANT
BLADE STERNUM SYSTEM 6 (BLADE) ×3 IMPLANT
BLADE SURG 15 STRL LF DISP TIS (BLADE) ×2 IMPLANT
BLADE SURG 15 STRL SS (BLADE) ×3
CANISTER SUCT 3000ML PPV (MISCELLANEOUS) ×3 IMPLANT
CANNULA GUNDRY RCSP 15FR (MISCELLANEOUS) ×3 IMPLANT
CATH HEART VENT LEFT (CATHETERS) ×2 IMPLANT
CATH ROBINSON RED A/P 18FR (CATHETERS) ×9 IMPLANT
CATH THORACIC 36FR (CATHETERS) ×3 IMPLANT
CATH THORACIC 36FR RT ANG (CATHETERS) ×3 IMPLANT
CNTNR URN SCR LID CUP LEK RST (MISCELLANEOUS) ×2 IMPLANT
CONT SPEC 4OZ STRL OR WHT (MISCELLANEOUS) ×3
CONTAINER PROTECT SURGISLUSH (MISCELLANEOUS) ×3 IMPLANT
COVER SURGICAL LIGHT HANDLE (MISCELLANEOUS) ×3 IMPLANT
DEVICE SUT CK QUICK LOAD MINI (Prosthesis & Implant Heart) ×3 IMPLANT
DRAPE CARDIOVASCULAR INCISE (DRAPES) ×3
DRAPE SRG 135X102X78XABS (DRAPES) ×2 IMPLANT
DRAPE WARM FLUID 44X44 (DRAPES) ×3 IMPLANT
DRSG COVADERM 4X14 (GAUZE/BANDAGES/DRESSINGS) ×3 IMPLANT
ELECT CAUTERY BLADE 6.4 (BLADE) ×3 IMPLANT
ELECT REM PT RETURN 9FT ADLT (ELECTROSURGICAL) ×6
ELECTRODE REM PT RTRN 9FT ADLT (ELECTROSURGICAL) ×4 IMPLANT
FELT TEFLON 1X6 (MISCELLANEOUS) ×6 IMPLANT
GAUZE SPONGE 4X4 12PLY STRL (GAUZE/BANDAGES/DRESSINGS) ×3 IMPLANT
GLOVE SURG ENC MOIS LTX SZ6 (GLOVE) IMPLANT
GLOVE SURG ENC MOIS LTX SZ6.5 (GLOVE) IMPLANT
GLOVE SURG ENC MOIS LTX SZ7 (GLOVE) IMPLANT
GLOVE SURG ENC MOIS LTX SZ7.5 (GLOVE) IMPLANT
GLOVE SURG MICRO LTX SZ6.5 (GLOVE) ×15 IMPLANT
GLOVE SURG MICRO LTX SZ7 (GLOVE) ×6 IMPLANT
GLOVE SURG UNDER POLY LF SZ6.5 (GLOVE) ×6 IMPLANT
GOWN STRL REUS W/ TWL LRG LVL3 (GOWN DISPOSABLE) ×12 IMPLANT
GOWN STRL REUS W/ TWL XL LVL3 (GOWN DISPOSABLE) ×2 IMPLANT
GOWN STRL REUS W/TWL LRG LVL3 (GOWN DISPOSABLE) ×18
GOWN STRL REUS W/TWL XL LVL3 (GOWN DISPOSABLE) ×3
HEMOSTAT POWDER SURGIFOAM 1G (HEMOSTASIS) ×9 IMPLANT
HEMOSTAT SURGICEL 2X14 (HEMOSTASIS) ×3 IMPLANT
KIT BASIN OR (CUSTOM PROCEDURE TRAY) ×3 IMPLANT
KIT CATH CPB BARTLE (MISCELLANEOUS) ×3 IMPLANT
KIT SUCTION CATH 14FR (SUCTIONS) ×3 IMPLANT
KIT SUT CK MINI COMBO 4X17 (Prosthesis & Implant Heart) ×3 IMPLANT
KIT TURNOVER KIT B (KITS) ×3 IMPLANT
LINE VENT (MISCELLANEOUS) ×3 IMPLANT
NS IRRIG 1000ML POUR BTL (IV SOLUTION) ×18 IMPLANT
PACK E OPEN HEART (SUTURE) ×3 IMPLANT
PACK OPEN HEART (CUSTOM PROCEDURE TRAY) ×3 IMPLANT
PAD ARMBOARD 7.5X6 YLW CONV (MISCELLANEOUS) ×6 IMPLANT
POSITIONER HEAD DONUT 9IN (MISCELLANEOUS) ×3 IMPLANT
SET MPS 3-ND DEL (MISCELLANEOUS) ×3 IMPLANT
SUT BONE WAX W31G (SUTURE) ×3 IMPLANT
SUT EB EXC GRN/WHT 2-0 V-5 (SUTURE) ×12 IMPLANT
SUT ETHIBON EXCEL 2-0 V-5 (SUTURE) ×3 IMPLANT
SUT ETHIBOND V-5 VALVE (SUTURE) ×6 IMPLANT
SUT PROLENE 3 0 SH DA (SUTURE) ×6 IMPLANT
SUT PROLENE 3 0 SH1 36 (SUTURE) ×3 IMPLANT
SUT PROLENE 4 0 RB 1 (SUTURE) ×21
SUT PROLENE 4-0 RB1 .5 CRCL 36 (SUTURE) ×14 IMPLANT
SUT SILK 2 0 SH CR/8 (SUTURE) ×3 IMPLANT
SUT STEEL 6MS V (SUTURE) ×3 IMPLANT
SUT STEEL SZ 6 DBL 3X14 BALL (SUTURE) ×6 IMPLANT
SUT VIC AB 1 CTX 36 (SUTURE) ×6
SUT VIC AB 1 CTX36XBRD ANBCTR (SUTURE) ×4 IMPLANT
SYSTEM SAHARA CHEST DRAIN ATS (WOUND CARE) ×3 IMPLANT
TAPE CLOTH 4X10 WHT NS (GAUZE/BANDAGES/DRESSINGS) ×3 IMPLANT
TAPE PAPER 2X10 WHT MICROPORE (GAUZE/BANDAGES/DRESSINGS) ×3 IMPLANT
TOWEL GREEN STERILE (TOWEL DISPOSABLE) ×3 IMPLANT
TOWEL GREEN STERILE FF (TOWEL DISPOSABLE) ×3 IMPLANT
TRAY FOLEY SLVR 16FR TEMP STAT (SET/KITS/TRAYS/PACK) ×3 IMPLANT
UNDERPAD 30X36 HEAVY ABSORB (UNDERPADS AND DIAPERS) ×3 IMPLANT
VALVE AORTIC SZ25 INSP/RESIL (Prosthesis & Implant Heart) ×3 IMPLANT
VENT LEFT HEART 12002 (CATHETERS) ×3
WATER STERILE IRR 1000ML POUR (IV SOLUTION) ×3 IMPLANT

## 2020-09-01 NOTE — Op Note (Signed)
09/01/2020 Kirk Campbell [id number]  Surgeon: Gaye Pollack, MD   First Assistant: RNFA  Preoperative Diagnosis: mediastinal bleeding s/p aortic valve replacement  Postoperative Diagnosis: Same   Procedure:  1.  Exploration of mediastinum for bleeding  Anesthesia: General Endotracheal   Clinical History/Surgical Indication:   The patient underwent AVR earlier today and was coagulopathic postop with bleeding from the sternal bone and soft tissues. He received 1 unit of plts and it appeared improved. The chest was closed and he appeared fairly dry and was taken to the ICU. He had about 400 cc out of his chest tubes over the first 90 minutes and the bleeding seemed to increase and decrease. I felt that it would be best to return to the OR for exploration. I discussed this with his wife.   Preparation:  The patient was taken directly to the operating room.The consent was signed by me. Preoperative antibiotics were given. The patient was positioned supine on the operating room table. After being placed under general endotracheal anesthesia by the anesthesia team the neck, chest, and abdomen were prepped with betadine soap and solution and draped in the usual sterile manner. A surgical time-out was taken and the correct patient and operative procedure were confirmed with the nursing and anesthesia staff.   Mediastinal exploration:   The previous median sternotomy was opened. Upon separating the sternum there was some fresh blood and clots in the mediastinum that were removed. The cannulation sites were hemostatic. There was some bleeding from the internal edge of the sternum but I think the main source was some bleeding from the lower most sternal wire site on the right side of the sternum. This was stopped with electrocautery. The mediastinum was irrigated with saline. There was complete hemostasis.   Completion:   Mediastinal drainage tubes were returned to their previous position in  the posterior pericardium and anterior mediastinum. The sternum was closed with double #6 stainless steel wires. The fascia was closed with continuous # 1 vicryl suture. The subcutaneous tissue was closed with 2-0 vicryl continuous suture. The skin was closed with 3-0 vicryl subcuticular suture. All sponge, needle, and instrument counts were reported correct at the end of the case. Dry sterile dressings were placed over the incisions and around the chest tubes which were connected to pleurevac suction. The patient was then transported to the surgical intensive care unit in satisfactory and stable condition.

## 2020-09-01 NOTE — Anesthesia Procedure Notes (Signed)
Central Venous Catheter Insertion Performed by: Murvin Natal, MD, anesthesiologist Start/End8/15/2022 6:45 AM, 09/01/2020 7:00 AM Patient location: Pre-op. Preanesthetic checklist: patient identified, IV checked, site marked, risks and benefits discussed, surgical consent, monitors and equipment checked, pre-op evaluation, timeout performed and anesthesia consent Position: Trendelenburg Lidocaine 1% used for infiltration and patient sedated Hand hygiene performed , maximum sterile barriers used  and Seldinger technique used Catheter size: 9 Fr Total catheter length 12. PA cath was placed.MAC introducer Swan type:thermodilution PA Cath depth:45 Procedure performed using ultrasound guided technique. Ultrasound Notes:anatomy identified, needle tip was noted to be adjacent to the nerve/plexus identified and no ultrasound evidence of intravascular and/or intraneural injection Attempts: 1 Following insertion, line sutured, dressing applied and Biopatch. Post procedure assessment: blood return through all ports and free fluid flow  Patient tolerated the procedure well with no immediate complications.

## 2020-09-01 NOTE — Progress Notes (Signed)
  Echocardiogram Echocardiogram Transesophageal has been performed.  Kirk Campbell 09/01/2020, 8:57 AM

## 2020-09-01 NOTE — Transfer of Care (Signed)
Immediate Anesthesia Transfer of Care Note  Patient: Kirk Campbell  Procedure(s) Performed: EXPLORATION POST OPERATIVE OPEN HEART (Chest)  Patient Location: ICU  Anesthesia Type:General  Level of Consciousness: Patient remains intubated per anesthesia plan  Airway & Oxygen Therapy: Patient remains intubated per anesthesia plan and Patient placed on Ventilator (see vital sign flow sheet for setting)  Post-op Assessment: Report given to RN and Post -op Vital signs reviewed and stable  Post vital signs: Reviewed and stable  Last Vitals:  Vitals Value Taken Time  BP 95/65   Temp    Pulse 80 09/01/20 1619  Resp 12 09/01/20 1619  SpO2 100 % 09/01/20 1619  Vitals shown include unvalidated device data.  Last Pain:  Vitals:   09/01/20 [id number]  TempSrc:   PainSc: 0-No pain         Complications: No notable events documented.

## 2020-09-01 NOTE — Progress Notes (Signed)
Patient ID: Kirk Campbell, male   DOB: Nov 02, 1960, 60 y.o.   MRN: [id number] TCTS:  Pt has been hemodynamically stable since arrival to ICU with brief hypertension. Chest tubes have put out 400 cc over the last hr or so despite platelets. He is making clot in the tubes. I think it is best to return to the OR to explore for bleeding. Discussed with wife.

## 2020-09-01 NOTE — Progress Notes (Signed)
Patient put out 400cc out of chest tube within first hour of arrival Dr Cyndia Bent remained at bedside since arrival and plans to take patient back to OR for reexploration at this time.

## 2020-09-01 NOTE — Progress Notes (Signed)
EVENING ROUNDS NOTE :     Kirk Campbell.[address]       [place], [phone]             [phone]                 Day of Surgery Procedure(s) (LRB): EXPLORATION POST OPERATIVE OPEN HEART (N/A)   Total Length of Stay:  LOS: 0 days  Events:   Weaning to extubate Low CT output    BP 98/73   Pulse 80   Temp (!) 97 F (36.1 C)   Resp 15   Ht '5\' 10"'$  (1.778 m)   Wt 83.9 kg   SpO2 100%   BMI 26.54 kg/m   PAP: (22-39)/(12-21) 32/18 CO:  [3.1 L/min-4 L/min] 4 L/min CI:  [1.6 L/min/m2-2 L/min/m2] 2 L/min/m2  Vent Mode: SIMV;PSV;PRVC FiO2 (%):  [50 %] 50 % Set Rate:  [12 bmp] 12 bmp Vt Set:  [580 mL] 580 mL PEEP:  [5 cmH20] 5 cmH20 Pressure Support:  [10 cmH20] 10 cmH20 Plateau Pressure:  [17 cmH20] 17 cmH20   sodium chloride 20 mL/hr at 09/01/20 1800   [START ON 09/02/2020] sodium chloride     sodium chloride 20 mL/hr at 09/01/20 1348   albumin human 12.5 g (09/01/20 1350)    ceFAZolin (ANCEF) IV     dexmedetomidine (PRECEDEX) IV infusion 0.4 mcg/kg/hr (09/01/20 1800)   famotidine (PEPCID) IV Stopped (09/01/20 1354)   insulin 1.2 Units/hr (09/01/20 1800)   lactated ringers     lactated ringers     lactated ringers 20 mL/hr at 09/01/20 1800   nitroGLYCERIN Stopped (09/01/20 1345)   phenylephrine (NEO-SYNEPHRINE) Adult infusion 5 mcg/min (09/01/20 1800)   vancomycin      No intake/output data recorded.   CBC Latest Ref Rng & Units 09/01/2020 09/01/2020 09/01/2020  WBC 4.0 - 10.5 K/uL 12.1(H) - 12.1(H)  Hemoglobin 13.0 - 17.0 g/dL 8.9(L) 10.2(L) 10.6(L)  Hematocrit 39.0 - 52.0 % 27.2(L) 30.0(L) 31.9(L)  Platelets 150 - 400 K/uL 152 - 150    BMP Latest Ref Rng & Units 09/01/2020 09/01/2020 09/01/2020  Glucose 70 - 99 mg/dL - - 136(H)  BUN 6 - 20 mg/dL - - 13  Creatinine 0.61 - 1.24 mg/dL - - 0.70  BUN/Creat Ratio 10 - 24 - - -  Sodium 135 - 145 mmol/L 143 141 140  Potassium 3.5 - 5.1 mmol/L 3.6 4.2 4.1  Chloride 98 - 111 mmol/L - - 102  CO2 22 - 32 mmol/L  - - -  Calcium 8.9 - 10.3 mg/dL - - -    ABG    Component Value Date/Time   PHART 7.407 09/01/2020 1316   PCO2ART 38.4 09/01/2020 1316   PO2ART 110 (H) 09/01/2020 1316   HCO3 24.5 09/01/2020 1316   TCO2 26 09/01/2020 1316   O2SAT 99.0 09/01/2020 [place], MD 09/01/2020 6:46 PM

## 2020-09-01 NOTE — Brief Op Note (Signed)
09/01/2020  10:44 AM  PATIENT:  Kirk Campbell  60 y.o. male  PRE-OPERATIVE DIAGNOSIS:  1. Severe Aortic insufficiency 2. Moderate to severe Aortic stenosis  POST-OPERATIVE DIAGNOSIS:  1. Severe Aortic insufficiency 2. Moderate to severe Aortic stenosis  PROCEDURE:  TRANSESOPHAGEAL ECHOCARDIOGRAM (TEE), AORTIC VALVE REPLACEMENT (AVR) USING MODEL #11500A INSPIRIS VALVE, SERIAL # [id number], SIZE 25MM)    SURGEON:  Surgeon(s) and Role:    Gaye Pollack, MD - Primary  PHYSICIAN ASSISTANT: Lars Pinks PA-C  ASSISTANTS: Dineen Kid RNFA   ANESTHESIA:   general  EBL:  200 mL   DRAINS:  Chest tubes placed in the mediastinal and pleural spaces    SPECIMEN:  Source of Specimen:  Native AV leaflets  DISPOSITION OF SPECIMEN:  PATHOLOGY  COUNTS CORRECT:  YES  DICTATION: .Dragon Dictation  PLAN OF CARE: Admit to inpatient   PATIENT DISPOSITION:  ICU - intubated and hemodynamically stable.   Delay start of Pharmacological VTE agent (>24hrs) due to surgical blood loss or risk of bleeding: yes  BASELINE WEIGHT: 83.9 kg

## 2020-09-01 NOTE — Procedures (Signed)
Extubation Procedure Note  Patient Details:   Name: Kirk Campbell DOB: [birth year] MRN: [id number]   Airway Documentation:    Vent end date: 09/01/20 Vent end time: 2025   Evaluation  O2 sats: stable throughout Complications: No apparent complications Patient did tolerate procedure well. Bilateral Breath Sounds: Clear, Diminished   Yes Patient performed a NIF -30, VC 1.8 L; extubated to 4 L [place].  Patient performed IS 750 mL with good effort.   Carrington Clamp A 09/01/2020, 8:31 PM

## 2020-09-01 NOTE — Anesthesia Procedure Notes (Signed)
Arterial Line Insertion Start/End8/15/2022 7:00 AM, 09/01/2020 7:07 AM Performed by: CRNA  Patient location: Pre-op. Preanesthetic checklist: patient identified, IV checked, site marked, risks and benefits discussed, surgical consent, monitors and equipment checked, pre-op evaluation, timeout performed and anesthesia consent Lidocaine 1% used for infiltration Left, radial was placed Catheter size: 20 G Hand hygiene performed  and maximum sterile barriers used   Attempts: 1 Procedure performed without using ultrasound guided technique. Following insertion, dressing applied and Biopatch. Post procedure assessment: normal and unchanged  Patient tolerated the procedure well with no immediate complications.

## 2020-09-01 NOTE — Interval H&P Note (Signed)
History and Physical Interval Note:  09/01/2020 6:50 AM  Kirk Campbell  has presented today for surgery, with the diagnosis of SEVERE AI AS.  The various methods of treatment have been discussed with the patient and family. After consideration of risks, benefits and other options for treatment, the patient has consented to  Procedure(s): AORTIC VALVE REPLACEMENT (AVR) (N/A) TRANSESOPHAGEAL ECHOCARDIOGRAM (TEE) (N/A) as a surgical intervention.  The patient's history has been reviewed, patient examined, no change in status, stable for surgery.  I have reviewed the patient's chart and labs.  Questions were answered to the patient's satisfaction.     Gaye Pollack

## 2020-09-01 NOTE — Anesthesia Postprocedure Evaluation (Signed)
Anesthesia Post Note  Patient: Kirk Campbell  Procedure(s) Performed: EXPLORATION POST OPERATIVE OPEN HEART (Chest)     Patient location during evaluation: SICU Anesthesia Type: General Level of consciousness: sedated Pain management: pain level controlled Vital Signs Assessment: post-procedure vital signs reviewed and stable Respiratory status: patient remains intubated per anesthesia plan Cardiovascular status: stable Postop Assessment: no apparent nausea or vomiting Anesthetic complications: no   No notable events documented.  Last Vitals:  Vitals:   09/01/20 1947 09/01/20 2000  BP:  (!) 89/65  Pulse:  80  Resp:  14  Temp:  37.3 C  SpO2: 100% 100%    Last Pain:  Vitals:   09/01/20 2000  TempSrc: Core  PainSc:                  Karyl Kinnier Shiro Ellerman

## 2020-09-01 NOTE — Hospital Course (Addendum)
HPI: The patient is a 60 year old gentleman with a history of aortic stenosis and aortic insufficiency followed by Dr. [name].  2D echocardiogram on 01/09/2020 showed a severely calcified and thickened aortic valve with severe aortic stenosis with a mean gradient of 52 mmHg and a peak gradient of 91 mmHg.  Dimensionless index was 0.22 and aortic valve area 0.8 cm.  There was felt to be moderate aortic insufficiency.  Left ventricular ejection fraction was 60 to 65% with a left ventricular internal diameter during diastole of 5.5 cm.  There is mild mitral regurgitation.  He was feeling fine at that time but now presents with increasing exertional fatigue and shortness of breath.  He has had no chest pressure or pain.  He has had some dizzy spells recently but no syncope.   2D echo on 04/15/2020 showed a mean gradient of 38 mmHg with a peak gradient of 65 mmHg.  There is felt to be severe aortic insufficiency with an AI pressure half-time of 200 ms.  Left ventricular internal diameter during diastole was 5.7 cm.  Left ventricular ejection fraction was 58%.  He subsequently underwent cardiac catheterization showing no evidence of coronary disease.  Filling pressures were normal.  The mean gradient across aortic valve was 38.7 mmHg with a peak to peak gradient of 35 mmHg and a valve area of 1.0 cm.  His AI pressure half-time has decreased further to 200 ms consistent with severe AI.  Dr. Cyndia Bent agreed that aortic valve replacement is indicated in this patient for relief of his symptoms and to prevent progressive left ventricular deterioration.  Dr. Cyndia Bent reviewed his echo and cath films with him and his wife and answered their questions.  Dr. Cyndia Bent discussed the alternatives of mechanical and bioprosthetic valves.  Given his age, Dr. Cyndia Bent thought a bioprosthetic valve would be a good choice and avoid the need for long-term anticoagulation. He presented to [facility] on 08/15 in order to undergo an AVR.  Hospital  Course: Patient was transported from the OR to ICU in stable condition. Because he had a fair amount of chest tube output immediately post op, it was decided to take patient back to the OR for exploration of mediastinum. The main source of the bleeding was from the lower most sternal wire site on the right side of the sternum. Hemostasis was achieved with electrocautery. He was extubated the evening of surgery without difficulty. He remained afebrile and hemodynamically stable. Gordy Councilman, a line, chest tubes, and foley were removed early in the post operative course. Lopressor was started and titrated accordingly. He was volume over loaded and diuresed. He had ABL anemia. He/she did not require a post op transfusion. Last H and H was ***. He was weaned off the insulin drip.  The patient's glucose remained well controlled.The patient's HGA1C pre op was 5.5. The patient was felt surgically stable for transfer from the ICU to PCTU for further convalescence on 08/17.  He continues to progress with cardiac rehab. He was ambulating on room air. He has been tolerating a diet and has had a bowel movement. Epicardial pacing wires were removed on 08/19 . Chest tube sutures will be removed the day of discharge. The patient is felt surgically stable for discharge today.

## 2020-09-01 NOTE — Op Note (Signed)
CARDIOVASCULAR SURGERY OPERATIVE NOTE  09/01/2020 KITT KERKHOFF [id number]  Surgeon:  Gaye Pollack, MD  First Assistant: Lars Pinks,  PA-C   Preoperative Diagnosis:  Severe aortic stenosis and aortic insufficiency   Postoperative Diagnosis:  Same   Procedure:  Median Sternotomy Extracorporeal circulation 3.   Aortic valve replacement using a 25 mm Edwards INSPIRIS RESILIA pericardial valve.  Anesthesia:  General Endotracheal   Clinical History/Surgical Indication:  This 60 year old gentleman has stage D, severe, symptomatic aortic stenosis and aortic insufficiency with New York Heart Association class II symptoms of exertional fatigue and shortness of breath consistent with chronic diastolic congestive heart failure.  He is also having episodes of dizziness and lightheadedness with activity I have personally reviewed his 2D echocardiogram and cardiac catheterization studies.  His echo shows a severely thickened and calcified aortic valve with markedly restricted mobility.  He mean gradient and December 2021 was 52 mmHg but was reduced to 38 mmHg on his most recent echo in March 2022.  His ejection fraction is minimally decreased.  His AI pressure half-time has decreased further to 200 ms consistent with severe AI.  I agree that aortic valve replacement is indicated in this patient for relief of his symptoms and to prevent progressive left ventricular deterioration.  I reviewed his echo and cath films with him and his wife and answered their questions.  I discussed the alternatives of mechanical and bioprosthetic valves.  Given his age I think a bioprosthetic valve would be a good choice and avoid the need for long-term anticoagulation with Coumadin. I discussed the operative procedure with the patient and his wife including alternatives, benefits and risks; including but not limited to bleeding, blood transfusion, infection, stroke, myocardial infarction, graft failure,  heart block requiring a permanent pacemaker, organ dysfunction, and death.  [name] understands and agrees to proceed.      Preparation:  The patient was seen in the preoperative holding area and the correct patient, correct operation were confirmed with the patient after reviewing the medical record and catheterization. The consent was signed by me. Preoperative antibiotics were given. A pulmonary arterial line and radial arterial line were placed by the anesthesia team. The patient was taken back to the operating room and positioned supine on the operating room table. After being placed under general endotracheal anesthesia by the anesthesia team a foley catheter was placed. The neck, chest, abdomen, and both legs were prepped with betadine soap and solution and draped in the usual sterile manner. A surgical time-out was taken and the correct patient and operative procedure were confirmed with the nursing and anesthesia staff.   Pre-bypass TEE:   Complete TEE assessment was performed by Dr. Roanna Banning. This showed an LVEF of 40-45% with a dilated LV. Severe AS and AI with heavily calcified bicuspid aortic valve.    Post-bypass TEE:   Normal functioning prosthetic aortic valve with no perivalvular leak or regurgitation through the valve. Mean gradient 6 mm Hg. Left ventricular function preserved. No mitral regurgitation.    Cardiopulmonary Bypass:  A median sternotomy was performed. The pericardium was opened in the midline. Right ventricular function appeared normal. The ascending aorta was of normal size and had no palpable plaque. There were no contraindications to aortic cannulation or cross-clamping. The patient was fully systemically heparinized and the ACT was maintained > 400 sec. The proximal aortic arch was cannulated with a 29F aortic cannula for arterial inflow. Venous cannulation was performed via the right atrial appendage using a  two-staged venous cannula. An antegrade  cardioplegia/vent cannula was inserted into the mid-ascending aorta. A left ventricular vent was placed via the right superior pulmonary vein. A retrograde cardioplegia cannnula was placed into the coronary sinus via the right atrium. Aortic occlusion was performed with a single cross-clamp. Systemic cooling to 32 degrees Centigrade and topical cooling of the heart with iced saline were used. Hyperkalemic antegrade cold blood cardioplegia was used to induce diastolic arrest and then cold blood retrograde cardioplegia was given at about 20 minute intervals throughout the period of arrest to maintain myocardial temperature at or below 10 degrees centigrade. A temperature probe was inserted into the interventricular septum and an insulating pad was placed in the pericardium. Carbon dioxide was insufflated into the pericardium at 5L/min throughout the procedure to minimize intracardiac air.   Aortic Valve Replacement:   A transverse aortotomy was performed 1 cm above the take-off of the right coronary artery. The native valve was Type 1 bicuspid with fusion of the right and non-coronary cusps with calcified leaflets and mild annular calcification. The ostia of the coronary arteries were in normal position and were not obstructed. The native valve leaflets were excised and the annulus was decalcified with rongeurs. Care was taken to remove all particulate debris. The left ventricle was directly inspected for debris and then irrigated with ice saline solution. The annulus was sized and a size 25 mm valve was chosen. The model number was [id number] and the serial number was [id number]. While the valve was being prepared 2-0 Ethibond pledgeted horizontal mattress sutures were placed around the annulus with the pledgets in a sub-annular position. The sutures were placed through the sewing ring and the valve lowered into place. The sutures were tied sequentially. The valve seated nicely and the coronary ostia were not  obstructed. The prosthetic valve leaflets moved normally and there was no sub-valvular obstruction. The aortotomy was closed using 4-0 Prolene suture in 2 layers with felt strips to reinforce the closure.  Completion:  The patient was rewarmed to 37 degrees Centigrade. De-airing maneuvers were performed and the head placed in trendelenburg position. The crossclamp was removed with a time of 83 minutes. There was spontaneous return of sinus rhythm. The aortotomy was checked for hemostasis. Two temporary epicardial pacing wires were placed on the right atrium and two on the right ventricle. The left ventricular vent and retrograde cardioplegia cannulas were removed. The patient was weaned from CPB without difficulty on no inotropes. CPB time was 106 minutes. Cardiac output was 5 LPM. Heparin was fully reversed with protamine and the aortic and venous cannulas removed. The patient appeared coagulopathic with a lot of oozing from the sternum. 1 units of plts was given since he was on ASA preop. Hemostasis was achieved. Mediastinal drainage tubes were placed. The sternum was closed with double #6 stainless steel wires. The fascia was closed with continuous # 1 vicryl suture. The subcutaneous tissue was closed with 2-0 vicryl continuous suture. The skin was closed with 3-0 vicryl subcuticular suture. All sponge, needle, and instrument counts were reported correct at the end of the case. Dry sterile dressings were placed over the incisions and around the chest tubes which were connected to pleurevac suction. The patient was then transported to the surgical intensive care unit in stable condition.

## 2020-09-01 NOTE — Anesthesia Procedure Notes (Addendum)
Procedure Name: Intubation Date/Time: 09/01/2020 7:51 AM Performed by: Dorthea Cove, CRNA Pre-anesthesia Checklist: Patient identified, Emergency Drugs available, Suction available and Patient being monitored Patient Re-evaluated:Patient Re-evaluated prior to induction Oxygen Delivery Method: Circle system utilized Preoxygenation: Pre-oxygenation with 100% oxygen Induction Type: IV induction Ventilation: Mask ventilation without difficulty and Oral airway inserted - appropriate to patient size Laryngoscope Size: Glidescope and 3 Grade View: Grade I Tube type: Oral Tube size: 8.0 mm Number of attempts: 1 Airway Equipment and Method: Stylet and Oral airway Placement Confirmation: ETT inserted through vocal cords under direct vision, positive ETCO2 and breath sounds checked- equal and bilateral Secured at: 23 cm Tube secured with: Tape Dental Injury: Teeth and Oropharynx as per pre-operative assessment

## 2020-09-01 NOTE — Transfer of Care (Addendum)
Immediate Anesthesia Transfer of Care Note  Patient: Kirk Campbell  Procedure(s) Performed: AORTIC VALVE REPLACEMENT (AVR) USING INSPIRIS VALVE SIZE 25MM (Chest) TRANSESOPHAGEAL ECHOCARDIOGRAM (TEE)  Patient Location: ICU  Anesthesia Type:General  Level of Consciousness: Patient remains intubated per anesthesia plan  Airway & Oxygen Therapy: Patient remains intubated per anesthesia plan and Patient placed on Ventilator (see vital sign flow sheet for setting)  Post-op Assessment: Report given to RN and Post -op Vital signs reviewed and stable  Post vital signs: Reviewed and stable  Last Vitals:  Vitals Value Taken Time  BP 100/65   Temp    Pulse 80 09/01/20 1307  Resp 12 09/01/20 1307  SpO2 100 % 09/01/20 1307  Vitals shown include unvalidated device data.  Last Pain:  Vitals:   09/01/20 [id number]  TempSrc:   PainSc: 0-No pain         Complications: No notable events documented.

## 2020-09-01 NOTE — Discharge Instructions (Signed)

## 2020-09-01 NOTE — Anesthesia Postprocedure Evaluation (Signed)
Anesthesia Post Note  Patient: Kirk Campbell  Procedure(s) Performed: AORTIC VALVE REPLACEMENT (AVR) USING INSPIRIS VALVE SIZE 25MM (Chest) TRANSESOPHAGEAL ECHOCARDIOGRAM (TEE)     Patient location during evaluation: SICU Anesthesia Type: General Level of consciousness: sedated Pain management: pain level controlled Vital Signs Assessment: post-procedure vital signs reviewed and stable Respiratory status: patient remains intubated per anesthesia plan Cardiovascular status: stable Postop Assessment: no apparent nausea or vomiting Anesthetic complications: no   No notable events documented.  Last Vitals:  Vitals:   09/01/20 0602 09/01/20 1308  BP: (!) 151/56   Pulse: 74   Resp: 18   Temp: 36.7 C   SpO2: 99% 100%    Last Pain:  Vitals:   09/01/20 0638  TempSrc:   PainSc: 0-No pain                 Elayna Tobler P Kale Rondeau

## 2020-09-01 NOTE — Anesthesia Preprocedure Evaluation (Addendum)
Anesthesia Evaluation  Patient identified by MRN, date of birth, ID band Patient unresponsive    Reviewed: Allergy & Precautions, Patient's Chart, lab work & pertinent test results  Airway Mallampati: Intubated   Neck ROM: Full    Dental   Pulmonary  Intubated   breath sounds clear to auscultation   + intubated    Cardiovascular + Valvular Problems/Murmurs AS and AI  Rhythm:Regular Rate:Normal + Diastolic murmurs- Systolic murmurs    Neuro/Psych    GI/Hepatic Neg liver ROS, GERD  Medicated and Controlled,  Endo/Other  negative endocrine ROS  Renal/GU negative Renal ROS     Musculoskeletal negative musculoskeletal ROS (+)   Abdominal   Peds  Hematology negative hematology ROS (+)   Anesthesia Other Findings Post op bleeding  Reproductive/Obstetrics                            Anesthesia Physical  Anesthesia Plan  ASA: 4 and emergent  Anesthesia Plan: General   Post-op Pain Management:    Induction: Intravenous  PONV Risk Score and Plan: 2 and Treatment may vary due to age or medical condition  Airway Management Planned: Oral ETT  Additional Equipment:   Intra-op Plan:   Post-operative Plan: Post-operative intubation/ventilation  Informed Consent: I have reviewed the patients History and Physical, chart, labs and discussed the procedure including the risks, benefits and alternatives for the proposed anesthesia with the patient or authorized representative who has indicated his/her understanding and acceptance.       Plan Discussed with: CRNA  Anesthesia Plan Comments: ( )        Anesthesia Quick Evaluation

## 2020-09-02 ENCOUNTER — Telehealth: Payer: Self-pay

## 2020-09-02 ENCOUNTER — Encounter (HOSPITAL_COMMUNITY): Payer: Self-pay | Admitting: Surgery

## 2020-09-02 ENCOUNTER — Inpatient Hospital Stay (HOSPITAL_COMMUNITY): Payer: 59

## 2020-09-02 LAB — CBC
HCT: 22.4 % — ABNORMAL LOW (ref 39.0–52.0)
HCT: 24.7 % — ABNORMAL LOW (ref 39.0–52.0)
Hemoglobin: 7.3 g/dL — ABNORMAL LOW (ref 13.0–17.0)
Hemoglobin: 7.6 g/dL — ABNORMAL LOW (ref 13.0–17.0)
MCH: 31.1 pg (ref 26.0–34.0)
MCH: 30.2 pg (ref 26.0–34.0)
MCHC: 32.6 g/dL (ref 30.0–36.0)
MCHC: 30.8 g/dL (ref 30.0–36.0)
MCV: 95.3 fL (ref 80.0–100.0)
MCV: 98 fL (ref 80.0–100.0)
Platelets: 136 10*3/uL — ABNORMAL LOW (ref 150–400)
Platelets: 175 10*3/uL (ref 150–400)
RBC: 2.35 MIL/uL — ABNORMAL LOW (ref 4.22–5.81)
RBC: 2.52 MIL/uL — ABNORMAL LOW (ref 4.22–5.81)
RDW: 14.1 % (ref 11.5–15.5)
RDW: 14.4 % (ref 11.5–15.5)
WBC: 10.8 10*3/uL — ABNORMAL HIGH (ref 4.0–10.5)
WBC: 16.2 10*3/uL — ABNORMAL HIGH (ref 4.0–10.5)
nRBC: 0 % (ref 0.0–0.2)
nRBC: 0 % (ref 0.0–0.2)

## 2020-09-02 LAB — GLUCOSE, CAPILLARY
Glucose-Capillary: 105 mg/dL — ABNORMAL HIGH (ref 70–99)
Glucose-Capillary: 108 mg/dL — ABNORMAL HIGH (ref 70–99)
Glucose-Capillary: 120 mg/dL — ABNORMAL HIGH (ref 70–99)
Glucose-Capillary: 121 mg/dL — ABNORMAL HIGH (ref 70–99)
Glucose-Capillary: 123 mg/dL — ABNORMAL HIGH (ref 70–99)
Glucose-Capillary: 132 mg/dL — ABNORMAL HIGH (ref 70–99)
Glucose-Capillary: 134 mg/dL — ABNORMAL HIGH (ref 70–99)
Glucose-Capillary: 140 mg/dL — ABNORMAL HIGH (ref 70–99)
Glucose-Capillary: 140 mg/dL — ABNORMAL HIGH (ref 70–99)
Glucose-Capillary: 146 mg/dL — ABNORMAL HIGH (ref 70–99)

## 2020-09-02 LAB — BPAM CRYOPRECIPITATE
Blood Product Expiration Date: 202208152120
Blood Product Expiration Date: 202208152120
ISSUE DATE / TIME: 202208151551
ISSUE DATE / TIME: 202208151551
Unit Type and Rh: 5100
Unit Type and Rh: 5100

## 2020-09-02 LAB — PREPARE CRYOPRECIPITATE
Unit division: 0
Unit division: 0

## 2020-09-02 LAB — BASIC METABOLIC PANEL
Anion gap: 6 (ref 5–15)
Anion gap: 9 (ref 5–15)
BUN: 11 mg/dL (ref 6–20)
BUN: 11 mg/dL (ref 6–20)
CO2: 24 mmol/L (ref 22–32)
CO2: 25 mmol/L (ref 22–32)
Calcium: 7.9 mg/dL — ABNORMAL LOW (ref 8.9–10.3)
Calcium: 8.3 mg/dL — ABNORMAL LOW (ref 8.9–10.3)
Chloride: 108 mmol/L (ref 98–111)
Chloride: 104 mmol/L (ref 98–111)
Creatinine, Ser: 0.76 mg/dL (ref 0.61–1.24)
Creatinine, Ser: 1 mg/dL (ref 0.61–1.24)
GFR, Estimated: >60 mL/min (ref >60)
GFR, Estimated: >60 mL/min (ref >60)
Glucose, Bld: 139 mg/dL — ABNORMAL HIGH (ref 70–99)
Glucose, Bld: 147 mg/dL — ABNORMAL HIGH (ref 70–99)
Potassium: 3.9 mmol/L (ref 3.5–5.1)
Potassium: 3.9 mmol/L (ref 3.5–5.1)
Sodium: 138 mmol/L (ref 135–145)
Sodium: 138 mmol/L (ref 135–145)

## 2020-09-02 LAB — MAGNESIUM
Magnesium: 2.2 mg/dL (ref 1.7–2.4)
Magnesium: 2.2 mg/dL (ref 1.7–2.4)

## 2020-09-02 LAB — TYPE AND SCREEN
ABO/RH(D): B NEG
Antibody Screen: NEGATIVE

## 2020-09-02 LAB — BPAM PLATELET PHERESIS
Blood Product Expiration Date: 202208172359
ISSUE DATE / TIME: 202208151158
Unit Type and Rh: 5100

## 2020-09-02 LAB — SURGICAL PATHOLOGY

## 2020-09-02 LAB — PREPARE PLATELET PHERESIS: Unit division: 0

## 2020-09-02 MED ORDER — INSULIN DETEMIR 100 UNIT/ML ~~LOC~~ SOLN
15.0000 [IU] | Freq: Once | SUBCUTANEOUS | Status: DC
  Filled 2020-09-02: qty 0.15

## 2020-09-02 MED ORDER — FUROSEMIDE 10 MG/ML IJ SOLN
40.0000 mg | Freq: Two times a day (BID) | INTRAMUSCULAR | Status: AC
  Administered 2020-09-02 (×2): 40 mg via INTRAVENOUS
  Filled 2020-09-02 (×2): qty 4

## 2020-09-02 MED ORDER — FE FUMARATE-B12-VIT C-FA-IFC PO CAPS
1.0000 | ORAL_CAPSULE | Freq: Two times a day (BID) | ORAL | Status: DC
  Administered 2020-09-02 – 2020-09-06 (×9): 1 via ORAL
  Filled 2020-09-02 (×10): qty 1

## 2020-09-02 MED ORDER — INSULIN ASPART 100 UNIT/ML IJ SOLN
0.0000 [IU] | INTRAMUSCULAR | Status: DC
  Administered 2020-09-02 (×2): 2 [IU] via SUBCUTANEOUS

## 2020-09-02 MED ORDER — KETOROLAC TROMETHAMINE 15 MG/ML IJ SOLN
15.0000 mg | Freq: Four times a day (QID) | INTRAMUSCULAR | Status: DC | PRN
  Administered 2020-09-02 – 2020-09-03 (×3): 15 mg via INTRAVENOUS
  Filled 2020-09-02 (×3): qty 1

## 2020-09-02 MED ORDER — ASPIRIN 81 MG PO CHEW: 81.0000 mg | CHEWABLE_TABLET | Freq: Every day | ORAL | Status: DC

## 2020-09-02 MED ORDER — INSULIN ASPART 100 UNIT/ML IJ SOLN: 0.0000 [IU] | INTRAMUSCULAR | Status: DC

## 2020-09-02 MED ORDER — METOCLOPRAMIDE HCL 5 MG/ML IJ SOLN
10.0000 mg | Freq: Four times a day (QID) | INTRAMUSCULAR | Status: AC
  Administered 2020-09-02 – 2020-09-03 (×4): 10 mg via INTRAVENOUS
  Filled 2020-09-02 (×4): qty 2

## 2020-09-02 MED ORDER — ASPIRIN EC 81 MG PO TBEC
81.0000 mg | DELAYED_RELEASE_TABLET | Freq: Every day | ORAL | Status: DC
  Administered 2020-09-02 – 2020-09-03 (×2): 81 mg via ORAL
  Filled 2020-09-02 (×2): qty 1

## 2020-09-02 MED ORDER — POTASSIUM CHLORIDE CRYS ER 20 MEQ PO TBCR
20.0000 meq | EXTENDED_RELEASE_TABLET | Freq: Three times a day (TID) | ORAL | Status: AC
  Administered 2020-09-02 (×3): 20 meq via ORAL
  Filled 2020-09-02 (×3): qty 1

## 2020-09-02 MED FILL — Thrombin (Recombinant) For Soln 20000 Unit: CUTANEOUS | Qty: 1 | Status: AC

## 2020-09-02 NOTE — Telephone Encounter (Signed)
STD/FMLA forms completed and faxed to [facility] @ [phone]. Beginning leave 09/01/20 through approx 12/01/20.

## 2020-09-02 NOTE — Discharge Summary (Signed)
Physician Discharge Summary       [address]       [place] [address]             [phone]    Patient ID: Kirk Campbell MRN: [id number] DOB/AGE: 61/02/1960 61 y.o.  Admit date: 09/01/2020 Discharge date: 09/06/2020  Admission Diagnoses: Severe aortic regurgitation and moderate to severe aortic stenosis  Discharge Diagnoses:  1. S/P aortic valve replacement with bioprosthetic valve 2. 3. History of right inguinal hernia 4. History of reflux   Consults: None  Procedure (s):  Median Sternotomy Extracorporeal circulation 3.   Aortic valve replacement using a 25 mm Edwards INSPIRIS RESILIA pericardial valve by Dr. Cyndia Campbell on 09/01/2020  4. Exploration of mediastinum for bleeding by Dr. Cyndia Campbell on 09/01/2020.  History of Presenting Illness: The patient is a 60 year old gentleman with a history of aortic stenosis and aortic insufficiency followed by Dr. [name].  2D echocardiogram on 01/09/2020 showed a severely calcified and thickened aortic valve with severe aortic stenosis with a mean gradient of 52 mmHg and a peak gradient of 91 mmHg.  Dimensionless index was 0.22 and aortic valve area 0.8 cm.  There was felt to be moderate aortic insufficiency.  Left ventricular ejection fraction was 60 to 65% with a left ventricular internal diameter during diastole of 5.5 cm.  There is mild mitral regurgitation.  He was feeling fine at that time but now presents with increasing exertional fatigue and shortness of breath.  He has had no chest pressure or pain.  He has had some dizzy spells recently but no syncope.   2D echo on 04/15/2020 showed a mean gradient of 38 mmHg with a peak gradient of 65 mmHg.  There is felt to be severe aortic insufficiency with an AI pressure half-time of 200 ms.  Left ventricular internal diameter during diastole was 5.7 cm.  Left ventricular ejection fraction was 58%.  He subsequently underwent cardiac catheterization showing no evidence of  coronary disease.  Filling pressures were normal.  The mean gradient across aortic valve was 38.7 mmHg with a peak to peak gradient of 35 mmHg and a valve area of 1.0 cm.  His AI pressure half-time has decreased further to 200 ms consistent with severe AI.  Dr. Cyndia Campbell agreed that aortic valve replacement is indicated in this patient for relief of his symptoms and to prevent progressive left ventricular deterioration.  Dr. Cyndia Campbell reviewed his echo and cath films with him and his wife and answered their questions.  Dr. Cyndia Campbell discussed the alternatives of mechanical and bioprosthetic valves.  Given his age, Dr. Cyndia Campbell thought a bioprosthetic valve would be a good choice and avoid the need for long-term anticoagulation. He [facility] on [facility]15 in order to undergo an AVR.  Brief Hospital Course: Hospital Course: Patient was transported from the OR to ICU in stable condition. Because he had a fair amount of chest tube output immediately post op, it was decided to take patient back to the OR for exploration of mediastinum. The main source of the bleeding was from the lower most sternal wire site on the right side of the sternum. Hemostasis was achieved with electrocautery. He was extubated the evening of surgery without difficulty. He remained afebrile and hemodynamically stable. Gordy Councilman, a line, chest tubes, and foley were removed early in the post operative course. Lopressor was started and titrated accordingly. He was volume over loaded and diuresed. He had ABL anemia. He did not require a  post op transfusion. Last H and H was 7.0 / 21% and this was stable and well tolerated. He was started on iron supplements and this will be continued at discharge.  He was weaned off the insulin drip while in the ICU.  The patient's glucose remained well controlled.The patient's HGA1C pre op was 5.5. The patient was felt surgically stable for transfer from the ICU to PCTU for further convalescence on 08/17.  He  continued to progress with cardiac rehab. He was ambulating on room air. He has been tolerating a diet and has had a bowel movement. Epicardial pacing wires were removed on 08/19 . Chest tube sutures will be removed the day of discharge. The patient is felt surgically stable for discharge today.     Latest Vital Signs: Blood pressure 128/81, pulse 88, temperature 98.5 F (36.9 C), temperature source Oral, resp. rate 19, height '5\' 10"'$  (1.778 m), weight 78.5 kg, SpO2 99 %.  Physical Exam:  General appearance: alert, cooperative, and no distress Heart: regular rate and rhythm, S1, S2 normal, no murmur, click, rub or gallop Lungs: clear to auscultation bilaterally Abdomen: soft, non-tender; bowel sounds normal; no masses,  no organomegaly Extremities: extremities normal, atraumatic, no cyanosis or edema Wound: clean and dry   Discharge Condition: Stable and discharged to home.  Recent laboratory studies:  Lab Results  Component Value Date   WBC 8.9 09/05/2020   HGB 7.0 (L) 09/05/2020   HCT 21.4 (L) 09/05/2020   MCV 94.7 09/05/2020   PLT 211 09/05/2020   Lab Results  Component Value Date   NA 136 09/05/2020   K 3.7 09/05/2020   CL 98 09/05/2020   CO2 29 09/05/2020   CREATININE 0.76 09/05/2020   GLUCOSE 99 09/05/2020      Diagnostic Studies: DG Chest 2 View  Result Date: 08/29/2020 CLINICAL DATA:  Preop evaluation EXAM: CHEST - 2 VIEW COMPARISON:  12/20/2014 FINDINGS: The heart size and mediastinal contours are within normal limits. Both lungs are clear. The visualized skeletal structures are unremarkable. IMPRESSION: No active cardiopulmonary disease. Electronically Signed   By: Franchot Gallo M.D.   On: 08/29/2020 11:46   DG Chest Port 1 View  Result Date: 09/03/2020 CLINICAL DATA:  Post AVR EXAM: PORTABLE CHEST 1 VIEW COMPARISON:  Portable exam 0512 hours compared to 09/02/2020 FINDINGS: Interval removal of mediastinal drains and Swan-Ganz catheter. RIGHT jugular catheter  with tip projecting over SVC. Enlargement of cardiac silhouette post AVR. Mild bibasilar atelectasis, slightly improved on LEFT. Upper lungs clear. No pleural effusion or pneumothorax. IMPRESSION: Mild bibasilar atelectasis. Electronically Signed   By: Lavonia Dana M.D.   On: 09/03/2020 08:43   DG Chest Port 1 View  Result Date: 09/02/2020 CLINICAL DATA:  Chest tube present status post open heart surgery, sore chest EXAM: PORTABLE CHEST 1 VIEW COMPARISON:  Chest radiograph dated 1 day prior FINDINGS: A right IJ Swan-Ganz catheter is in stable position projecting over the expected location of the right ventricular outflow tract. The endotracheal tube has been removed. The enteric catheter is coiled in the stomach with the tip projecting over the stomach. Median sternotomy wires and cardiac valve prosthesis are unchanged. A mediastinal drain is unchanged. The cardiomediastinal silhouette is stable. There is a stable small left pleural effusion with adjacent airspace disease likely reflecting subsegmental atelectasis. The right lung is clear. There is no pneumothorax. A small nodular opacity projecting over the right apex likely correspond to a calcified granuloma seen on prior CT chest. The bones  are stable. Cervical spine fusion hardware is again noted. IMPRESSION: 1. Interval extubation. Otherwise, stable position of the support devices as above. 2. Stable small left pleural effusion with adjacent subsegmental atelectasis. Electronically Signed   By: Valetta Mole M.D.   On: 09/02/2020 08:30   DG CHEST PORT 1 VIEW  Result Date: 09/01/2020 CLINICAL DATA:  Status post AVR EXAM: PORTABLE CHEST 1 VIEW COMPARISON:  09/01/2020 FINDINGS: Swan-Ganz catheter with the tip projecting over the right ventricular outflow tract. Endotracheal tube with the tip 4.5 cm above the carina. Nasogastric tube coursing below the diaphragm. Mediastinal drains noted. No focal consolidation, pleural effusion or pneumothorax. Stable  cardiomediastinal silhouette. Median sternotomy and aortic valve replacement. Swan-Ganz catheter with the tip projecting over the right ventricular outflow tract. IMPRESSION: 1. Support lines and tubing in satisfactory position. Electronically Signed   By: Kathreen Devoid M.D.   On: 09/01/2020 17:26   DG Chest Port 1 View  Result Date: 09/01/2020 CLINICAL DATA:  AVR and TEE today, pneumothorax EXAM: PORTABLE CHEST 1 VIEW COMPARISON:  08/28/2020 chest radiograph. FINDINGS: Right internal jugular Swan-Ganz catheter terminates over the main pulmonary artery. Intact sternotomy wires. Enteric tube terminates in the proximal stomach. Aortic valve prosthesis in place. Endotracheal tube tip is 3.9 cm above the carina. Right and inferior mediastinal drains in place. Stable cardiomediastinal silhouette with mild cardiomegaly. No pneumothorax. No pleural effusion. Minimal bibasilar atelectasis. No pulmonary edema. IMPRESSION: No pneumothorax. Minimal bibasilar atelectasis. Well-positioned support structures. Mild cardiomegaly without pulmonary edema. Electronically Signed   By: Ilona Sorrel M.D.   On: 09/01/2020 13:35   ECHO INTRAOPERATIVE TEE  Result Date: 09/01/2020  *INTRAOPERATIVE TRANSESOPHAGEAL REPORT *  Patient Name:   Kirk Campbell Date of Exam: 09/01/2020 Medical Rec #:  [id number]          Height:       70.0 in Accession #:    [id number]         Weight:       185.0 lb Date of Birth:  1960/05/16         BSA:          2.02 m Patient Age:    69 years           BP:           151/56 mmHg Patient Gender: M                  HR:           67 bpm. Exam Location:  Inpatient Transesophogeal exam was perform intraoperatively during surgical procedure. Patient was closely monitored under general anesthesia during the entirety of examination. Indications:     Aortic valve replacement Sonographer:     Clayton Lefort RDCS (AE) Performing Phys: Adele Barthel MD Diagnosing Phys: Adele Barthel MD Complications: No known  complications during this procedure. POST-OP IMPRESSIONS _ Left Ventricle: The left ventricle is unchanged from pre-bypass. _ Right Ventricle: The right ventricle appears unchanged from pre-bypass. _ Aorta: The aorta appears unchanged from pre-bypass. _ Left Atrial Appendage: The left atrial appendage appears unchanged from pre-bypass. _ Aortic Valve: There is no stenosis or regurgitation post replacement. The gradient recorded across the prosthetic valve is within the expected range. No perivalvular leak noted. _ Mitral Valve: The mitral valve appears unchanged from pre-bypass. _ Tricuspid Valve: The tricuspid valve appears unchanged from pre-bypass. _ Pulmonic Valve: The pulmonic valve appears unchanged from pre-bypass. _ Interatrial Septum: The interatrial septum appears unchanged from pre-bypass.  _ Pericardium: The pericardium appears unchanged from pre-bypass. PRE-OP FINDINGS  Left Ventricle: The left ventricle has mildly reduced systolic function, with an ejection fraction of 45-50%. The cavity size was mildly dilated. There is mildly increased left ventricular wall thickness. There is mild concentric left ventricular hypertrophy. Right Ventricle: The right ventricle has normal systolic function. The cavity was normal. There is no increase in right ventricular wall thickness. Left Atrium: Left atrial size was normal in size. No left atrial/left atrial appendage thrombus was detected. Right Atrium: Right atrial size was normal in size. Interatrial Septum: No atrial level shunt detected by color flow Doppler. Pericardium: There is no evidence of pericardial effusion. Mitral Valve: The mitral valve is normal in structure. Mitral valve regurgitation is not visualized by color flow Doppler. Tricuspid Valve: The tricuspid valve was normal in structure. Tricuspid valve regurgitation was not visualized by color flow Doppler. Aortic Valve: The aortic valve appears bicuspid. Aortic valve regurgitation is moderate by  color flow Doppler. There is moderate stenosis of the aortic valve. There is moderate thickening, moderate calcification, and decreased mobility of the valves. Pulmonic Valve: The pulmonic valve was normal in structure. Pulmonic valve regurgitation is not visualized by color flow Doppler. Aorta: The aortic root, ascending aorta and aortic arch are normal in size and structure.  Adele Barthel MD Electronically signed by Adele Barthel MD Signature Date/Time: 09/01/2020/2:24:53 PM    Final    VAS US DOPPLER PRE CABG  Result Date: 08/28/2020 PREOPERATIVE VASCULAR EVALUATION Patient Name:  KAMORION SHIDLER  Date of Exam:   08/28/2020 Medical Rec #: [id number]           Accession #:    [id number] Date of Birth: [birth year]          Patient Gender: M Patient Age:   58 years Exam Location:  [facility] Procedure:      VAS US DOPPLER PRE CABG Referring Phys: Gilford Raid --------------------------------------------------------------------------------  Indications:      Pre-AVR. Comparison Study: No prior study Performing Technologist: Maudry Mayhew MHA, RVT, RDCS, RDMS  Examination Guidelines: A complete evaluation includes B-mode imaging, spectral Doppler, color Doppler, and power Doppler as needed of all accessible portions of each vessel. Bilateral testing is considered an integral part of a complete examination. Limited examinations for reoccurring indications may be performed as noted.  Right Carotid Findings: +----------+--------+--------+--------+--------+--------+           PSV cm/sEDV cm/sStenosisDescribeComments +----------+--------+--------+--------+--------+--------+ CCA Prox  86      11                               +----------+--------+--------+--------+--------+--------+ CCA Distal78      15                               +----------+--------+--------+--------+--------+--------+ ICA Prox  79      13                                +----------+--------+--------+--------+--------+--------+ ICA Distal90      25                               +----------+--------+--------+--------+--------+--------+ ECA       119     5                                +----------+--------+--------+--------+--------+--------+ +----------+--------+-------+----------------+------------+  PSV cm/sEDV cmsDescribe        Arm Pressure +----------+--------+-------+----------------+------------+ Subclavian156            Multiphasic, WNL             +----------+--------+-------+----------------+------------+ +---------+--------+--+--------+--+---------+ VertebralPSV cm/s53EDV cm/s11Antegrade +---------+--------+--+--------+--+---------+ Left Carotid Findings: +----------+--------+--------+--------+--------+--------+           PSV cm/sEDV cm/sStenosisDescribeComments +----------+--------+--------+--------+--------+--------+ CCA Prox  97      15                               +----------+--------+--------+--------+--------+--------+ CCA Distal92      20                               +----------+--------+--------+--------+--------+--------+ ICA Prox  78      15                               +----------+--------+--------+--------+--------+--------+ ICA Distal94      29                               +----------+--------+--------+--------+--------+--------+ ECA       86                                       +----------+--------+--------+--------+--------+--------+ +----------+--------+--------+----------------+------------+ SubclavianPSV cm/sEDV cm/sDescribe        Arm Pressure +----------+--------+--------+----------------+------------+           188             Multiphasic, WNL             +----------+--------+--------+----------------+------------+ +---------+--------+--+--------+--+---------+ VertebralPSV cm/s51EDV cm/s10Antegrade +---------+--------+--+--------+--+---------+   ABI Findings: +--------+------------------+-----+---------+--------+ Right   Rt Pressure (mmHg)IndexWaveform Comment  +--------+------------------+-----+---------+--------+ Brachial130                    triphasic         +--------+------------------+-----+---------+--------+ +--------+------------------+-----+---------+-------+ Left    Lt Pressure (mmHg)IndexWaveform Comment +--------+------------------+-----+---------+-------+ [id number]                    triphasic        +--------+------------------+-----+---------+-------+  Right Doppler Findings: +-----------+--------+-----+---------+-----------------------------------------+ Site       PressureIndexDoppler  Comments                                  +-----------+--------+-----+---------+-----------------------------------------+ Brachial   130          triphasic                                          +-----------+--------+-----+---------+-----------------------------------------+ Radial                  triphasic                                          +-----------+--------+-----+---------+-----------------------------------------+ Ulnar                   triphasic                                          +-----------+--------+-----+---------+-----------------------------------------+  Palmar Arch                      Signal decreases >50% with radial                                          compression, is unaffected with ulnar                                      compression.                              +-----------+--------+-----+---------+-----------------------------------------+  Left Doppler Findings: +-----------+--------+-----+---------+-----------------------------------------+ Site       PressureIndexDoppler  Comments                                  +-----------+--------+-----+---------+-----------------------------------------+ Brachial   137          triphasic                                           +-----------+--------+-----+---------+-----------------------------------------+ Radial                  triphasic                                          +-----------+--------+-----+---------+-----------------------------------------+ Ulnar                   triphasic                                          +-----------+--------+-----+---------+-----------------------------------------+ Palmar Arch                      Signal is unaffected with radial                                           compression, decreases >50% with ulnar                                     compression.                              +-----------+--------+-----+---------+-----------------------------------------+  Summary: Right Carotid: The extracranial vessels were near-normal with only minimal wall                thickening or plaque. Left Carotid: The extracranial vessels were near-normal with only minimal wall               thickening or plaque. Vertebrals:  Bilateral vertebral arteries demonstrate antegrade flow. Subclavians: Normal flow hemodynamics were seen in bilateral subclavian  arteries. Right Upper Extremity: No significant arterial obstruction detected in the right upper extremity. Doppler waveforms decrease >50% with right radial compression. Doppler waveforms remain within normal limits with right ulnar compression. Left Upper Extremity: No significant arterial obstruction detected in the left upper extremity. Doppler waveforms remain within normal limits with left radial compression. Doppler waveforms decrease >50% with left ulnar compression.  Electronically signed by Monica Martinez MD on 08/28/2020 at 2:11:47 PM.    Final     Discharge Instructions     Amb Referral to Cardiac Rehabilitation   Complete by: As directed    Will send Cardiac Rehab Phase 2 order to [place]   Diagnosis: Valve Replacement   Valve: Aortic   After initial  evaluation and assessments completed: Virtual Based Care may be provided alone or in conjunction with Phase 2 Cardiac Rehab based on patient barriers.: Yes       Discharge Medications: Allergies as of 09/06/2020   No Known Allergies      Medication List     STOP taking these medications    ibuprofen 200 MG tablet Commonly known as: ADVIL   naproxen sodium 220 MG tablet Commonly known as: ALEVE       TAKE these medications    acetaminophen 325 MG tablet Commonly known as: TYLENOL Take 2 tablets (650 mg total) by mouth every 6 (six) hours as needed for mild pain.   aspirin EC 81 MG tablet Take 81 mg by mouth daily.   ferrous [id number] C-folic acid capsule Commonly known as: TRINSICON / FOLTRIN Take 1 capsule by mouth 2 (two) times daily after a meal.   Magnesium 400 MG Tabs Take 400 mg by mouth 2 (two) times daily.   Melatonin 10 MG Tabs Take 10 mg by mouth at bedtime.   metoprolol tartrate 25 MG tablet Commonly known as: LOPRESSOR Take 1 tablet (25 mg total) by mouth 2 (two) times daily.   omeprazole 20 MG tablet Commonly known as: PRILOSEC OTC Take 20 mg by mouth daily as needed (acid reflux).   traMADol 50 MG tablet Commonly known as: ULTRAM Take 1 tablet (50 mg total) by mouth every 6 (six) hours as needed for up to 5 days for moderate pain.   Vitamin D3 125 MCG (5000 UT) Caps Take 5,000 Units by mouth 2 (two) times daily.   Vitamin E 450 MG (1000 UT) Caps Take 450 mg by mouth daily.       The patient has been discharged on:   1.Beta Blocker:  Yes [  x ]                              No   [   ]                              If No, reason:  2.Ace Inhibitor/ARB: Yes [   ]                                     No  [  x  ]                                     If No, reason: titration of bb  3.Statin:   Yes [   ]                  No  [  x ]                  If No, reason:No CAD  4.S[place]a [place]:  Yes  [ x  ]                  No   [   ]                   If No, reason:  Patient had ACS upon admission: No  Plavix/P2Y12 inhibitor: Yes [   ]                                      No  [   x]   Follow Up Appointments:  Follow-up Information     Gaye Pollack, MD. Go on 10/01/2020.   Specialty: Cardiothoracic Surgery Why: PA/LAT CXR to be taken (at [facility] which is in the same building as Dr. Vivi Martens office) on 09/14 at 9:00 am;Appointment time is at 9:30 am Contact information: [address] [place] [address] [phone]         Richardson Dopp T, PA-C. Go on 09/30/2020.   Specialties: Cardiology, Physician Assistant Why: Appointment is at 11:15 am Contact information: [address] [place] [address] [phone]         nursing suture removal appointment Follow up.   Why: Our office will call you with a time and date for your suture removal.                Signed: Ravleen Ries N ContePA-C 09/06/2020, 8:23 AM

## 2020-09-02 NOTE — Progress Notes (Signed)
1 Day Post-Op Procedure(s) (LRB): EXPLORATION POST OPERATIVE OPEN HEART (N/A) Subjective: Sore but otherwise feels fine  Objective: Vital signs in last 24 hours: Temp:  [95.7 F (35.4 C)-100.2 F (37.9 C)] 99.9 F (37.7 C) (08/16 0600) Pulse Rate:  [68-100] 100 (08/16 0600) Cardiac Rhythm: Normal sinus rhythm (08/16 0400) Resp:  [12-20] 18 (08/16 0600) BP: (86-121)/(57-74) 121/68 (08/16 0600) SpO2:  [93 %-100 %] 96 % (08/16 0600) Arterial Line BP: (81-154)/(49-95) 131/53 (08/16 0600) FiO2 (%):  [40 %-50 %] 40 % (08/15 2000) Weight:  [89.2 kg] 89.2 kg (08/16 0500)  Hemodynamic parameters for last 24 hours: PAP: (20-39)/(7-21) 28/10 CO:  [3.1 L/min-7.4 L/min] 6.8 L/min CI:  [1.6 L/min/m2-3.7 L/min/m2] 3.4 L/min/m2  Intake/Output from previous day: 08/15 0701 - 08/16 0700 In: 7258.5 [I.V.:3821.1; Blood:1197; IV Piggyback:2240.4] Out: [id number] [Urine:2935; Blood:1002; Chest Tube:690] Intake/Output this shift: Total I/O In: 1593.6 [I.V.:464; IV Piggyback:1129.6] Out: 860 [Urine:630; Chest Tube:230]  General appearance: alert and cooperative Neurologic: intact Heart: regular rate and rhythm, S1, S2 normal, no murmur Lungs: clear to auscultation bilaterally Extremities: edema mild Wound: dressing dry  Lab Results: Recent Labs    09/01/20 2110 09/01/20 2113 09/02/20 0427  WBC 12.5*  --  10.8*  HGB 7.7* 7.5* 7.3*  HCT 24.4* 22.0* 22.4*  PLT 145*  --  136*   BMET:  Recent Labs    09/01/20 2110 09/01/20 2113 09/02/20 0427  NA 139 141 138  K 3.9 3.9 3.9  CL 109  --  108  CO2 24  --  24  GLUCOSE 171*  --  139*  BUN 13  --  11  CREATININE 0.71  --  0.76  CALCIUM 7.4*  --  7.9*    PT/INR:  Recent Labs    09/01/20 1310  LABPROT 18.1*  INR 1.5*   ABG    Component Value Date/Time   PHART 7.336 (L) 09/01/2020 2113   HCO3 24.7 09/01/2020 2113   TCO2 26 09/01/2020 2113   ACIDBASEDEF 1.0 09/01/2020 2113   O2SAT 100.0 09/01/2020 2113   CBG (last 3)  Recent  Labs    09/02/20 0206 09/02/20 0354 09/02/20 0604  GLUCAP 140* 134* 121*   CXR: mild bibasilar atelectasis  ECG: pending  Assessment/Plan:  POD 1 s/p AVR with pericardial valve. Doing well.  Hemodynamically stable in sinus rhythm. Continue Lopressor.  Volume excess: start diuresis.  Acute postop blood loss anemia: should improve with diuresis and will start iron. BP is good.  DC chest tubes, swan, arterial line.  IS, OOB, mobilize.  Still on insulin drip but he does not have DM with normal Hgb A1c on no meds. Will give one dose of Levemir this am and switch to SSI. Probably be able to stop that tomorrow.  LOS: 1 day    Gaye Pollack 09/02/2020

## 2020-09-02 NOTE — Progress Notes (Signed)
Patient ID: Kirk Campbell, male   DOB: 1960-10-26, 60 y.o.   MRN: [id number]  TCTS Evening Rounds:   Hemodynamically stable  Sinus rhythm. Still has some nausea Ambulated around ICU but shaky.  Urine output good    CBC    Component Value Date/Time   WBC 16.2 (H) 09/02/2020 1654   RBC 2.52 (L) 09/02/2020 1654   HGB 7.6 (L) 09/02/2020 1654   HGB 15.7 06/25/2020 0912   HCT 24.7 (L) 09/02/2020 1654   HCT 46.6 06/25/2020 0912   PLT 175 09/02/2020 1654   PLT 188 06/25/2020 0912   MCV 98.0 09/02/2020 1654   MCV 90 06/25/2020 0912   MCH 30.2 09/02/2020 1654   MCHC 30.8 09/02/2020 1654   RDW 14.4 09/02/2020 1654   RDW 12.7 06/25/2020 0912     BMET    Component Value Date/Time   NA 138 09/02/2020 1654   NA 143 06/25/2020 0912   K 3.9 09/02/2020 1654   CL 104 09/02/2020 1654   CO2 25 09/02/2020 1654   GLUCOSE 147 (H) 09/02/2020 1654   BUN 11 09/02/2020 1654   BUN 16 06/25/2020 0912   CREATININE 1.00 09/02/2020 1654   CALCIUM 8.3 (L) 09/02/2020 1654   GFRNONAA >60 09/02/2020 1654   GFRAA >60 12/20/2014 1426     A/P:  Stable postop course. Continue current plans

## 2020-09-03 ENCOUNTER — Inpatient Hospital Stay (HOSPITAL_COMMUNITY): Payer: 59

## 2020-09-03 LAB — BASIC METABOLIC PANEL
Anion gap: 6 (ref 5–15)
BUN: 12 mg/dL (ref 6–20)
CO2: 27 mmol/L (ref 22–32)
Calcium: 8.4 mg/dL — ABNORMAL LOW (ref 8.9–10.3)
Chloride: 104 mmol/L (ref 98–111)
Creatinine, Ser: 0.74 mg/dL (ref 0.61–1.24)
GFR, Estimated: >60 mL/min (ref >60)
Glucose, Bld: 126 mg/dL — ABNORMAL HIGH (ref 70–99)
Potassium: 4.3 mmol/L (ref 3.5–5.1)
Sodium: 137 mmol/L (ref 135–145)

## 2020-09-03 LAB — CBC
HCT: 21.6 % — ABNORMAL LOW (ref 39.0–52.0)
Hemoglobin: 6.9 g/dL — CL (ref 13.0–17.0)
MCH: 31.2 pg (ref 26.0–34.0)
MCHC: 31.9 g/dL (ref 30.0–36.0)
MCV: 97.7 fL (ref 80.0–100.0)
Platelets: 128 10*3/uL — ABNORMAL LOW (ref 150–400)
RBC: 2.21 MIL/uL — ABNORMAL LOW (ref 4.22–5.81)
RDW: 14.5 % (ref 11.5–15.5)
WBC: 10.5 10*3/uL (ref 4.0–10.5)
nRBC: 0 % (ref 0.0–0.2)

## 2020-09-03 LAB — GLUCOSE, CAPILLARY
Glucose-Capillary: 104 mg/dL — ABNORMAL HIGH (ref 70–99)
Glucose-Capillary: 108 mg/dL — ABNORMAL HIGH (ref 70–99)
Glucose-Capillary: 109 mg/dL — ABNORMAL HIGH (ref 70–99)
Glucose-Capillary: 111 mg/dL — ABNORMAL HIGH (ref 70–99)

## 2020-09-03 MED ORDER — ~~LOC~~ CARDIAC SURGERY, PATIENT & FAMILY EDUCATION: Freq: Once | Status: AC

## 2020-09-03 MED ORDER — BISACODYL 10 MG RE SUPP: 10.0000 mg | Freq: Every day | RECTAL | Status: DC

## 2020-09-03 MED ORDER — DOCUSATE SODIUM 100 MG PO CAPS
200.0000 mg | ORAL_CAPSULE | Freq: Every day | ORAL | Status: DC
  Administered 2020-09-03 – 2020-09-04 (×2): 200 mg via ORAL
  Filled 2020-09-03: qty 2

## 2020-09-03 MED ORDER — CHLORHEXIDINE GLUCONATE CLOTH 2 % EX PADS
6.0000 | MEDICATED_PAD | Freq: Every day | CUTANEOUS | Status: DC
  Administered 2020-09-03: 6 via TOPICAL

## 2020-09-03 MED ORDER — METOLAZONE 2.5 MG PO TABS
2.5000 mg | ORAL_TABLET | Freq: Once | ORAL | Status: AC
  Administered 2020-09-03: 2.5 mg via ORAL
  Filled 2020-09-03: qty 1

## 2020-09-03 MED ORDER — SODIUM CHLORIDE 0.9 % IV SOLN: 250.0000 mL | INTRAVENOUS | Status: DC | PRN

## 2020-09-03 MED ORDER — ACETAMINOPHEN 325 MG PO TABS
650.0000 mg | ORAL_TABLET | Freq: Four times a day (QID) | ORAL | Status: DC | PRN
  Administered 2020-09-03: 650 mg via ORAL
  Filled 2020-09-03: qty 2

## 2020-09-03 MED ORDER — ASPIRIN EC 81 MG PO TBEC
81.0000 mg | DELAYED_RELEASE_TABLET | Freq: Every day | ORAL | Status: DC
  Administered 2020-09-04 – 2020-09-06 (×3): 81 mg via ORAL
  Filled 2020-09-03 (×3): qty 1

## 2020-09-03 MED ORDER — FUROSEMIDE 40 MG PO TABS
40.0000 mg | ORAL_TABLET | Freq: Every day | ORAL | Status: DC
  Administered 2020-09-03 – 2020-09-06 (×4): 40 mg via ORAL
  Filled 2020-09-03 (×4): qty 1

## 2020-09-03 MED ORDER — TRAMADOL HCL 50 MG PO TABS
50.0000 mg | ORAL_TABLET | ORAL | Status: DC | PRN
  Administered 2020-09-03 – 2020-09-06 (×7): 50 mg via ORAL
  Filled 2020-09-03 (×7): qty 1

## 2020-09-03 MED ORDER — ONDANSETRON HCL 4 MG/2ML IJ SOLN: 4.0000 mg | Freq: Four times a day (QID) | INTRAMUSCULAR | Status: DC | PRN

## 2020-09-03 MED ORDER — ONDANSETRON HCL 4 MG PO TABS: 4.0000 mg | ORAL_TABLET | Freq: Four times a day (QID) | ORAL | Status: DC | PRN

## 2020-09-03 MED ORDER — SODIUM CHLORIDE 0.9% FLUSH: 3.0000 mL | INTRAVENOUS | Status: DC | PRN

## 2020-09-03 MED ORDER — POTASSIUM CHLORIDE CRYS ER 20 MEQ PO TBCR
20.0000 meq | EXTENDED_RELEASE_TABLET | Freq: Two times a day (BID) | ORAL | Status: AC
  Administered 2020-09-03 – 2020-09-04 (×4): 20 meq via ORAL
  Filled 2020-09-03 (×4): qty 1

## 2020-09-03 MED ORDER — PANTOPRAZOLE SODIUM 40 MG PO TBEC
40.0000 mg | DELAYED_RELEASE_TABLET | Freq: Every day | ORAL | Status: DC
  Administered 2020-09-04 – 2020-09-06 (×3): 40 mg via ORAL
  Filled 2020-09-03 (×3): qty 1

## 2020-09-03 MED ORDER — SODIUM CHLORIDE 0.9% FLUSH
3.0000 mL | Freq: Two times a day (BID) | INTRAVENOUS | Status: DC
  Administered 2020-09-03 – 2020-09-05 (×6): 3 mL via INTRAVENOUS

## 2020-09-03 MED ORDER — METOPROLOL TARTRATE 12.5 MG HALF TABLET
12.5000 mg | ORAL_TABLET | Freq: Two times a day (BID) | ORAL | Status: DC
  Administered 2020-09-03 – 2020-09-04 (×3): 12.5 mg via ORAL
  Filled 2020-09-03 (×3): qty 1

## 2020-09-03 MED ORDER — BISACODYL 5 MG PO TBEC
10.0000 mg | DELAYED_RELEASE_TABLET | Freq: Every day | ORAL | Status: DC
  Administered 2020-09-03: 10 mg via ORAL

## 2020-09-03 MED ORDER — SENNOSIDES-DOCUSATE SODIUM 8.6-50 MG PO TABS: 1.0000 | ORAL_TABLET | Freq: Two times a day (BID) | ORAL | Status: DC | PRN

## 2020-09-03 NOTE — Progress Notes (Signed)
CARDIAC REHAB PHASE I   PRE:  Rate/Rhythm: 92 SR  BP:  Sitting: 127/74      SaO2: 97   MODE:  Ambulation: 370 ft   POST:  Rate/Rhythm: 112 ST  BP:  Sitting: 117/75    SaO2: 100 RA   Pt assisted out of bathroom and ambulated 355f in hallway standby assist with EVA and slow, steady gait. Pt denies CP, SOB, or dizziness throughout walk. Returned to bathroom. Encouraged continued ambulation and IS use. All questions and concerns addressed. Will continue to follow.   1CM:[id number] Falco RN BSN 09/03/2020 1:36 PM

## 2020-09-03 NOTE — Progress Notes (Signed)
2 Days Post-Op Procedure(s) (LRB): EXPLORATION POST OPERATIVE OPEN HEART (N/A) Subjective: No complaints. Nausea resolved. Did not sleep much. Has not ambulated yet this am.  Objective: Vital signs in last 24 hours: Temp:  [98.2 F (36.8 C)-99.3 F (37.4 C)] 98.8 F (37.1 C) (08/17 0600) Pulse Rate:  [80-105] 105 (08/17 0700) Cardiac Rhythm: Normal sinus rhythm (08/17 0400) Resp:  [12-21] 15 (08/17 0700) BP: (100-136)/(69-88) 129/77 (08/17 0700) SpO2:  [93 %-100 %] 98 % (08/17 0700) Arterial Line BP: (105-139)/(54-65) 139/65 (08/16 0900) Weight:  [87.5 kg] 87.5 kg (08/17 0500)  Hemodynamic parameters for last 24 hours: PAP: (28-31)/(13-15) 31/15 CO:  [7.1 L/min] 7.1 L/min CI:  [3.5 L/min/m2] 3.5 L/min/m2  Intake/Output from previous day: 08/16 0701 - 08/17 0700 In: 1390 [P.O.:780; I.V.:410; IV Piggyback:200] Out: 2665 [Urine:2625; Chest Tube:40] Intake/Output this shift: No intake/output data recorded.  General appearance: alert and cooperative Neurologic: intact Heart: regular rate and rhythm Lungs: clear to auscultation bilaterally Extremities: edema mild Wound: dressing dry  Lab Results: Recent Labs    09/02/20 1654 09/03/20 0400  WBC 16.2* 10.5  HGB 7.6* 6.9*  HCT 24.7* 21.6*  PLT 175 128*   BMET:  Recent Labs    09/02/20 1654 09/03/20 0400  NA 138 137  K 3.9 4.3  CL 104 104  CO2 25 27  GLUCOSE 147* 126*  BUN 11 12  CREATININE 1.00 0.74  CALCIUM 8.3* 8.4*    PT/INR:  Recent Labs    09/01/20 1310  LABPROT 18.1*  INR 1.5*   ABG    Component Value Date/Time   PHART 7.336 (L) 09/01/2020 2113   HCO3 24.7 09/01/2020 2113   TCO2 26 09/01/2020 2113   ACIDBASEDEF 1.0 09/01/2020 2113   O2SAT 100.0 09/01/2020 2113   CBG (last 3)  Recent Labs    09/03/20 0012 09/03/20 0425 09/03/20 0647  GLUCAP 108* 104* 111*   CXR: improved aeration of bases  Assessment/Plan:  POD 2 AVR  Hemodynamically stable in sinus rhythm. Continue low dose  Lopressor.  Acute postop blood loss anemia: Hgb slightly lower at 6.9 from 7.6 yesterday but he is asymptomatic and may be diluted since drawn from sleeve. Will hold off on transfusion and continue iron, diuresis. Check again Friday.  Volume excess: -1275 cc yesterday and wt down. Still 8 lbs over preop. Continue diuresis and KCL.  DC CBG's  DC sleeve and foley.  Had nausea with narcotics. Will continue toradol and ultram for pain.  Transfer to 4E and continue mobilization.    LOS: 2 days    Gaye Pollack 09/03/2020

## 2020-09-03 NOTE — Progress Notes (Signed)
Attempt to call report made.

## 2020-09-04 MED FILL — Electrolyte-R (PH 7.4) Solution: INTRAVENOUS | Qty: 4000 | Status: AC

## 2020-09-04 MED FILL — Lidocaine HCl Local Preservative Free (PF) Inj 2%: INTRAMUSCULAR | Qty: 15 | Status: AC

## 2020-09-04 MED FILL — Heparin Sodium (Porcine) Inj 1000 Unit/ML: INTRAMUSCULAR | Qty: 30 | Status: AC

## 2020-09-04 MED FILL — Potassium Chloride Inj 2 mEq/ML: INTRAVENOUS | Qty: 40 | Status: AC

## 2020-09-04 MED FILL — Heparin Sodium (Porcine) Inj 1000 Unit/ML: INTRAMUSCULAR | Qty: 10 | Status: AC

## 2020-09-04 MED FILL — Sodium Bicarbonate IV Soln 8.4%: INTRAVENOUS | Qty: 50 | Status: AC

## 2020-09-04 MED FILL — Sodium Chloride IV Soln 0.9%: INTRAVENOUS | Qty: 2000 | Status: AC

## 2020-09-04 MED FILL — Mannitol IV Soln 20%: INTRAVENOUS | Qty: 500 | Status: AC

## 2020-09-04 NOTE — Progress Notes (Signed)
CARDIAC REHAB PHASE I   PRE:  Rate/Rhythm: 111 ST  MODE:  Ambulation: 450 ft   POST:  Rate/Rhythm: 123 ST   Pt ambulated 4102f in hallway independently with slow, steady gait. Pt denies CP, SOB, or dizziness. Pt returned to BR. Encouraged continued ambulation and IS use. Told pt he could walk with family if he desired. No questions or concerns at this time. Will continue to follow.  1OM:[id number] Falco RN BSN 09/04/2020 11:17 AM

## 2020-09-04 NOTE — Progress Notes (Signed)
3 Days Post-Op Procedure(s) (LRB): EXPLORATION POST OPERATIVE OPEN HEART (N/A) Subjective: No complaints. Slept well. Ambulated this am without shortness or breath.  Monitor review shows no arrhythmias. Mild resting sinus tach 101.  Objective: Vital signs in last 24 hours: Temp:  [98.2 F (36.8 C)-99 F (37.2 C)] 98.2 F (36.8 C) (08/18 0406) Pulse Rate:  [86-128] 90 (08/18 0406) Cardiac Rhythm: Normal sinus rhythm (08/17 1905) Resp:  [14-25] 20 (08/18 0406) BP: (108-145)/(69-93) 118/78 (08/18 0406) SpO2:  [95 %-99 %] 96 % (08/18 0406)  Hemodynamic parameters for last 24 hours:    Intake/Output from previous day: 08/17 0701 - 08/18 0700 In: 480 [P.O.:480] Out: 1445 [Urine:1445] Intake/Output this shift: No intake/output data recorded.  General appearance: alert and cooperative Neurologic: intact Heart: regular rate and rhythm, S1, S2 normal, no murmur Lungs: clear to auscultation bilaterally Extremities: edema mild Wound: dressing dry  Lab Results: Recent Labs    09/02/20 1654 09/03/20 0400  WBC 16.2* 10.5  HGB 7.6* 6.9*  HCT 24.7* 21.6*  PLT 175 128*   BMET:  Recent Labs    09/02/20 1654 09/03/20 0400  NA 138 137  K 3.9 4.3  CL 104 104  CO2 25 27  GLUCOSE 147* 126*  BUN 11 12  CREATININE 1.00 0.74  CALCIUM 8.3* 8.4*    PT/INR:  Recent Labs    09/01/20 1310  LABPROT 18.1*  INR 1.5*   ABG    Component Value Date/Time   PHART 7.336 (L) 09/01/2020 2113   HCO3 24.7 09/01/2020 2113   TCO2 26 09/01/2020 2113   ACIDBASEDEF 1.0 09/01/2020 2113   O2SAT 100.0 09/01/2020 2113   CBG (last 3)  Recent Labs    09/03/20 0425 09/03/20 0647 09/03/20 1114  GLUCAP 104* 111* 109*    Assessment/Plan:  POD 3 Hemodynamically stable in sinus rhythm. Continue low dose Lopressor. HR should come down as anemia improves.  Acute postop blood loss anemia: repeat CBC in am tomorrow. Continue iron.  Volume excess: no wt yet today but still has some edema.  Continue diuresis.  IS, ambulation.  DC pacing wires tomorrow. Anticipate home Saturday.  LOS: 3 days    Gaye Pollack 09/04/2020

## 2020-09-05 LAB — BASIC METABOLIC PANEL
Anion gap: 9 (ref 5–15)
BUN: 14 mg/dL (ref 6–20)
CO2: 29 mmol/L (ref 22–32)
Calcium: 9 mg/dL (ref 8.9–10.3)
Chloride: 98 mmol/L (ref 98–111)
Creatinine, Ser: 0.76 mg/dL (ref 0.61–1.24)
GFR, Estimated: >60 mL/min (ref >60)
Glucose, Bld: 99 mg/dL (ref 70–99)
Potassium: 3.7 mmol/L (ref 3.5–5.1)
Sodium: 136 mmol/L (ref 135–145)

## 2020-09-05 LAB — CBC
HCT: 21.4 % — ABNORMAL LOW (ref 39.0–52.0)
Hemoglobin: 7 g/dL — ABNORMAL LOW (ref 13.0–17.0)
MCH: 31 pg (ref 26.0–34.0)
MCHC: 32.7 g/dL (ref 30.0–36.0)
MCV: 94.7 fL (ref 80.0–100.0)
Platelets: 211 10*3/uL (ref 150–400)
RBC: 2.26 MIL/uL — ABNORMAL LOW (ref 4.22–5.81)
RDW: 14 % (ref 11.5–15.5)
WBC: 8.9 10*3/uL (ref 4.0–10.5)
nRBC: 0 % (ref 0.0–0.2)

## 2020-09-05 MED ORDER — POTASSIUM CHLORIDE CRYS ER 20 MEQ PO TBCR
20.0000 meq | EXTENDED_RELEASE_TABLET | Freq: Three times a day (TID) | ORAL | Status: AC
  Administered 2020-09-05 (×3): 20 meq via ORAL
  Filled 2020-09-05 (×3): qty 1

## 2020-09-05 MED ORDER — METOPROLOL TARTRATE 25 MG PO TABS
25.0000 mg | ORAL_TABLET | Freq: Two times a day (BID) | ORAL | Status: DC
  Administered 2020-09-05 – 2020-09-06 (×3): 25 mg via ORAL
  Filled 2020-09-05 (×3): qty 1

## 2020-09-05 NOTE — Progress Notes (Addendum)
      [address]       [place], [id number]             [phone]      4 Days Post-Op Procedure(s) (LRB): EXPLORATION POST OPERATIVE OPEN HEART (N/A) Subjective: Up in the bedside chair, already walked this morning. No new concerns.   On RA with adequate O2 sats.    Objective: Vital signs in last 24 hours: Temp:  [97.9 F (36.6 C)-98.5 F (36.9 C)] 98.3 F (36.8 C) (08/19 0329) Pulse Rate:  [88-99] 88 (08/19 0329) Cardiac Rhythm: Sinus tachycardia (08/18 1911) Resp:  [16-20] 18 (08/19 0329) BP: (101-129)/(78-90) 128/83 (08/19 0329) SpO2:  [94 %-97 %] 94 % (08/19 0329) Weight:  [81.2 kg] 81.2 kg (08/19 0500)     Intake/Output from previous day: No intake/output data recorded. Intake/Output this shift: No intake/output data recorded.  General appearance: alert and cooperative, no distress Neurologic: intact Heart: regular rhythm, Sinus tach 100-120. Lungs: breath sounds are clear Extremities: no peripheral edema Wound: dressing removed, the sternotomy incision is well approximated and dry.   Lab Results: Recent Labs    09/03/20 0400 09/05/20 0211  WBC 10.5 8.9  HGB 6.9* 7.0*  HCT 21.6* 21.4*  PLT 128* 211    BMET:  Recent Labs    09/03/20 0400 09/05/20 0211  NA 137 136  K 4.3 3.7  CL 104 98  CO2 27 29  GLUCOSE 126* 99  BUN 12 14  CREATININE 0.74 0.76  CALCIUM 8.4* 9.0     PT/INR:  No results for input(s): LABPROT, INR in the last 72 hours.  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 Progressing well, independent with mobility and transfers. Remove pader wires today.   Acute postop blood loss anemia:  Hct stable, tolerating well. Continue Fe supplement.   Volume excess: wt now 2kg below pre-op.  Marland KitchenDisposition- plan for discharge to home tomorrow.    LOS: 4 days   Malon Kindle [id number] 09/05/2020   Chart reviewed, patient examined, agree with above. He feels better every day. He has mild resting sinus tach due to anemia but this will improve with time as Hgb improves. It is stable at 7.0. Continue iron. Increase Lopressor to 25 bid. I doubt wt today is accurate. I don't think he lost 14 lbs yesterday. Will give him one more dose of lasix and more K+ today. Plan home tomorrow.

## 2020-09-05 NOTE — Progress Notes (Signed)
Pt ambulated with family in hallway x2 this afternoon independently. Tolerated well.  Clyde Canterbury, RN

## 2020-09-05 NOTE — Progress Notes (Signed)
CARDIAC REHAB PHASE I   Offered to walk with pt. Pt with plans to ambulate with son when he arrives. D/c education completed with pt. Pt educated on importance of site care and monitoring incisions daily. Encouraged continued IS use, walks, and sternal precautions. Pt given in-the-tube sheet with heart healthy diet. Reviewed restrictions and exercise guidelines. Will refer to CRP II [place].  PU:[id number] Rufina Falco, RN BSN 09/05/2020 2:48 PM

## 2020-09-05 NOTE — Progress Notes (Signed)
EPW removed per order. Tips intact. Pt tolerated well. Pt educated on 1 hour bedrest. Call light in reach.  Clyde Canterbury, RN

## 2020-09-06 ENCOUNTER — Emergency Department (HOSPITAL_COMMUNITY)
Admission: EM | Admit: 2020-09-06 | Discharge: 2020-09-06 | Disposition: A | Discharge status: Home / Self Care | Payer: 59 | Attending: Emergency Medicine | Admitting: Emergency Medicine

## 2020-09-06 ENCOUNTER — Other Ambulatory Visit: Payer: Self-pay

## 2020-09-06 ENCOUNTER — Emergency Department (HOSPITAL_COMMUNITY): Payer: 59

## 2020-09-06 ENCOUNTER — Encounter (HOSPITAL_COMMUNITY): Payer: Self-pay | Admitting: Emergency Medicine

## 2020-09-06 DIAGNOSIS — D649 Anemia, unspecified: Secondary | ICD-10-CM

## 2020-09-06 DIAGNOSIS — Z7982 Long term (current) use of aspirin: Secondary | ICD-10-CM | POA: Insufficient documentation

## 2020-09-06 DIAGNOSIS — R55 Syncope and collapse: Secondary | ICD-10-CM | POA: Insufficient documentation

## 2020-09-06 LAB — CBC WITH DIFFERENTIAL/PLATELET
Abs Immature Granulocytes: 0.14 10*3/uL — ABNORMAL HIGH (ref 0.00–0.07)
Basophils Absolute: 0.1 10*3/uL (ref 0.0–0.1)
Basophils Relative: 1 %
Eosinophils Absolute: 0.2 10*3/uL (ref 0.0–0.5)
Eosinophils Relative: 2 %
HCT: 28 % — ABNORMAL LOW (ref 39.0–52.0)
Hemoglobin: 8.7 g/dL — ABNORMAL LOW (ref 13.0–17.0)
Immature Granulocytes: 1 %
Lymphocytes Relative: 14 %
Lymphs Abs: 1.5 10*3/uL (ref 0.7–4.0)
MCH: 30.5 pg (ref 26.0–34.0)
MCHC: 31.1 g/dL (ref 30.0–36.0)
MCV: 98.2 fL (ref 80.0–100.0)
Monocytes Absolute: 1.2 10*3/uL — ABNORMAL HIGH (ref 0.1–1.0)
Monocytes Relative: 11 %
Neutro Abs: 7.9 10*3/uL — ABNORMAL HIGH (ref 1.7–7.7)
Neutrophils Relative %: 71 %
Platelets: 291 10*3/uL (ref 150–400)
RBC: 2.85 MIL/uL — ABNORMAL LOW (ref 4.22–5.81)
RDW: 14.5 % (ref 11.5–15.5)
WBC: 11 10*3/uL — ABNORMAL HIGH (ref 4.0–10.5)
nRBC: 0 % (ref 0.0–0.2)

## 2020-09-06 LAB — COMPREHENSIVE METABOLIC PANEL
ALT: 31 U/L (ref 0–44)
AST: 44 U/L — ABNORMAL HIGH (ref 15–41)
Albumin: 3.5 g/dL (ref 3.5–5.0)
Alkaline Phosphatase: 54 U/L (ref 38–126)
Anion gap: 10 (ref 5–15)
BUN: 17 mg/dL (ref 6–20)
CO2: 25 mmol/L (ref 22–32)
Calcium: 8.7 mg/dL — ABNORMAL LOW (ref 8.9–10.3)
Chloride: 103 mmol/L (ref 98–111)
Creatinine, Ser: 0.94 mg/dL (ref 0.61–1.24)
GFR, Estimated: >60 mL/min (ref >60)
Glucose, Bld: 128 mg/dL — ABNORMAL HIGH (ref 70–99)
Potassium: 4.7 mmol/L (ref 3.5–5.1)
Sodium: 138 mmol/L (ref 135–145)
Total Bilirubin: 1.7 mg/dL — ABNORMAL HIGH (ref 0.3–1.2)
Total Protein: 6.3 g/dL — ABNORMAL LOW (ref 6.5–8.1)

## 2020-09-06 MED ORDER — METOPROLOL TARTRATE 25 MG PO TABS: 25.0000 mg | ORAL_TABLET | Freq: Two times a day (BID) | ORAL | 5 refills | Status: DC

## 2020-09-06 MED ORDER — ACETAMINOPHEN 325 MG PO TABS
650.0000 mg | ORAL_TABLET | Freq: Four times a day (QID) | ORAL | Status: DC | PRN
Start: 2020-09-06 — End: 2021-04-15

## 2020-09-06 MED ORDER — FE FUMARATE-B12-VIT C-FA-IFC PO CAPS: 1.0000 | ORAL_CAPSULE | Freq: Two times a day (BID) | ORAL | 1 refills | Status: DC

## 2020-09-06 MED ORDER — TRAMADOL HCL 50 MG PO TABS: 50.0000 mg | ORAL_TABLET | Freq: Four times a day (QID) | ORAL | 0 refills | Status: AC | PRN

## 2020-09-06 NOTE — Progress Notes (Addendum)
      [address]       [place] [address]             [phone]      5 Days Post-Op Procedure(s) (LRB): EXPLORATION POST OPERATIVE OPEN HEART (N/A) Subjective: Ready to go home today, pain is well controlled.  Objective: Vital signs in last 24 hours: Temp:  [98 F (36.7 C)-98.6 F (37 C)] 98.5 F (36.9 C) (08/20 0425) Pulse Rate:  [88-102] 88 (08/20 0425) Cardiac Rhythm: Normal sinus rhythm;Sinus tachycardia (08/19 1940) Resp:  [13-20] 19 (08/20 0425) BP: (105-132)/(73-88) 128/81 (08/20 0425) SpO2:  [93 %-99 %] 99 % (08/20 0425) Weight:  [78.5 kg] 78.5 kg (08/20 0425)     Intake/Output from previous day: 08/19 0701 - 08/20 0700 In: 240 [P.O.:240] Out: -  Intake/Output this shift: No intake/output data recorded.  General appearance: alert, cooperative, and no distress Heart: regular rate and rhythm, S1, S2 normal, no murmur, click, rub or gallop Lungs: clear to auscultation bilaterally Abdomen: soft, non-tender; bowel sounds normal; no masses,  no organomegaly Extremities: extremities normal, atraumatic, no cyanosis or edema Wound: clean and dry  Lab Results: Recent Labs    09/05/20 0211  WBC 8.9  HGB 7.0*  HCT 21.4*  PLT 211   BMET:  Recent Labs    09/05/20 0211  NA 136  K 3.7  CL 98  CO2 29  GLUCOSE 99  BUN 14  CREATININE 0.76  CALCIUM 9.0    PT/INR: No results for input(s): LABPROT, INR in the last 72 hours. ABG    Component Value Date/Time   PHART 7.336 (L) 09/01/2020 2113   HCO3 24.7 09/01/2020 2113   TCO2 26 09/01/2020 2113   ACIDBASEDEF 1.0 09/01/2020 2113   O2SAT 100.0 09/01/2020 2113   CBG (last 3)  Recent Labs    09/03/20 1114  GLUCAP 109*    Assessment/Plan: S/P Procedure(s) (LRB): EXPLORATION POST OPERATIVE OPEN HEART (N/A)  CV-NSR in the 80s, BP well controlled Pulm- tolerating room air with good oxygen saturation Renal-creatinine 0.76, volume overload but now weight is down below pre-op H and H  7.0/21.4, stable, continue Fe supplement Pacer wires removed yesterday  Plan: Instructions reviewed with the patient and wife at the bedside. Questions regarding discharge answered. Follow-up has been arranged.    LOS: 5 days    Elgie Collard 09/06/2020  Patient seen and examined, looks great Home today  Revonda Standard. Roxan Hockey, MD [facility] [phone]

## 2020-09-06 NOTE — ED Triage Notes (Signed)
Pt BIB [facility], pt had aortic valve replacement here 8/15, pt discharged home at 1100 today. Pt went to take a shower and had 2-3 syncopal episodes at home, each episode for about 1 minute. Pt diaphoretic on EMS arrival, BP 60 palp, 80% on room air. Pt given 89m NS. Last BP 102/56.

## 2020-09-06 NOTE — ED Provider Notes (Signed)
[facility] Provider Note   CSN: [id number] Arrival date & time: 09/06/20  1423     History Chief Complaint  Patient presents with   Loss of Consciousness    Kirk Campbell is a 60 y.o. male presenting for evaluation of syncope.   Patient states he recently had heart surgery, aortic valve replacement, and was discharged from the hospital today at 11 AM.  He got home and took a shower, while in the shower he started to feel presyncopal.  He sat down and passed out.  Did not hit his head.  Wife was present and witnessed the syncope.  Per EMS, on their arrival patient was hypotensive with blood pressure in the 60s and hypoxic with sats of 80% on room air.  He appeared very pale and diaphoretic at the time.  He has received fluids and supplemental oxygen with EMS, symptoms have improved greatly.  Patient states he is feeling a little bit tired, but otherwise feels well.  No chest pain prior to the syncope or currently.  No fevers or chills.  No nausea, vomiting, abd pain, change in urination or bowel movements.  He is not on blood thinners.  Additional history obtained from chart review.  Reviewed recent hospitalization, patient had aortic valve replacement complicated by mediastinal bleeding after the procedure.  Patient was found to be anemic after surgery, however hemoglobin has been stable, discharged today with a hemoglobin of 7.  Additional history of GERD.   HPI     Past Medical History:  Diagnosis Date   GERD (gastroesophageal reflux disease)    Heart murmur    Reflux    Right inguinal hernia     Patient Active Problem List   Diagnosis Date Noted   S/P aortic valve replacement with bioprosthetic valve 09/01/2020   Severe aortic regurgitation 09/01/2020   Severe aortic insufficiency    Severe aortic stenosis 03/29/2020   S/P cervical spinal fusion 12/27/2014    Past Surgical History:  Procedure Laterality Date   ANTERIOR  CERVICAL DECOMP/DISCECTOMY FUSION N/A 12/27/2014   Procedure: C5-6, C6-7 Anterior Cervical Discectomy and Fusion, Allograft, Plate;  Surgeon: Marybelle Killings, MD;  Location: [facility];  Service: Orthopedics;  Laterality: N/A;   AORTIC VALVE REPLACEMENT N/A 09/01/2020   Procedure: AORTIC VALVE REPLACEMENT (AVR) USING INSPIRIS VALVE SIZE 25MM;  Surgeon: Gaye Pollack, MD;  Location: [facility];  Service: Open Heart Surgery;  Laterality: N/A;   EXPLORATION POST OPERATIVE OPEN HEART N/A 09/01/2020   Procedure: EXPLORATION POST OPERATIVE OPEN HEART;  Surgeon: Gaye Pollack, MD;  Location: [facility];  Service: Open Heart Surgery;  Laterality: N/A;   HERNIA REPAIR Right    inguinal hernia   RIGHT/LEFT HEART CATH AND CORONARY ANGIOGRAPHY N/A 06/27/2020   Procedure: RIGHT/LEFT HEART CATH AND CORONARY ANGIOGRAPHY;  Surgeon: Burnell Blanks, MD;  Location: [facility];  Service: Cardiovascular;  Laterality: N/A;   TEE WITHOUT CARDIOVERSION N/A 09/01/2020   Procedure: TRANSESOPHAGEAL ECHOCARDIOGRAM (TEE);  Surgeon: Gaye Pollack, MD;  Location: [facility];  Service: Open Heart Surgery;  Laterality: N/A;       Family History  Problem Relation Age of Onset   Cancer Neg Hx    Heart attack Neg Hx    Diabetes Neg Hx    Hyperlipidemia Neg Hx    Hypertension Neg Hx     Social History   Tobacco Use   Smoking status: Never   Smokeless tobacco: Never  Vaping Use   Vaping Use: Never used  Substance Use Topics   Alcohol use: Yes    Alcohol/week: 0.0 standard drinks    Comment: Socially   Drug use: Never    Home Medications Prior to Admission medications   Medication Sig Start Date End Date Taking? Authorizing Provider  acetaminophen (TYLENOL) 325 MG tablet Take 2 tablets (650 mg total) by mouth every 6 (six) hours as needed for mild pain. 09/06/20   Elgie Collard, PA-C  aspirin EC 81 MG tablet Take 81 mg by mouth daily.    [provider]  Cholecalciferol (VITAMIN D3) 125 MCG (5000 UT) CAPS  Take 5,000 Units by mouth 2 (two) times daily.    [provider]  ferrous [id number] C-folic acid (TRINSICON / FOLTRIN) capsule Take 1 capsule by mouth 2 (two) times daily after a meal. 09/06/20   Elgie Collard, PA-C  Magnesium 400 MG TABS Take 400 mg by mouth 2 (two) times daily.    [provider]  Melatonin 10 MG TABS Take 10 mg by mouth at bedtime.    [provider]  metoprolol tartrate (LOPRESSOR) 25 MG tablet Take 1 tablet (25 mg total) by mouth 2 (two) times daily. 09/06/20   Elgie Collard, PA-C  omeprazole (PRILOSEC OTC) 20 MG tablet Take 20 mg by mouth daily as needed (acid reflux). 04/18/20   [provider]  traMADol (ULTRAM) 50 MG tablet Take 1 tablet (50 mg total) by mouth every 6 (six) hours as needed for up to 5 days for moderate pain. 09/06/20 09/11/20  Elgie Collard, PA-C  Vitamin E 450 MG (1000 UT) CAPS Take 450 mg by mouth daily.    [provider]    Allergies    Patient has no known allergies.  Review of Systems   Review of Systems  Neurological:  Positive for syncope.  All other systems reviewed and are negative.  Physical Exam Updated Vital Signs BP 126/71   Pulse 89   Temp 97.8 F (36.6 C) (Oral)   Resp (!) 22   Ht '5\' 10"'$  (1.778 m)   Wt 81.2 kg   SpO2 97%   BMI 25.68 kg/m   Physical Exam Vitals and nursing note reviewed.  Constitutional:      General: He is not in acute distress.    Appearance: Normal appearance.     Comments: nontoxic  HENT:     Head: Normocephalic and atraumatic.  Eyes:     Conjunctiva/sclera: Conjunctivae normal.     Pupils: Pupils are equal, round, and reactive to light.  Cardiovascular:     Rate and Rhythm: Normal rate and regular rhythm.     Pulses: Normal pulses.  Pulmonary:     Effort: Pulmonary effort is normal. No respiratory distress.     Breath sounds: Normal breath sounds. No wheezing.     Comments: Speaking in full sentences.  Clear lung sounds in all fields.   Large scar noted on the anterior chest without signs of infection including tenderness, redness, induration, purulence Abdominal:     General: There is no distension.     Palpations: Abdomen is soft. There is no mass.     Tenderness: There is no abdominal tenderness. There is no guarding or rebound.  Musculoskeletal:        General: Normal range of motion.     Cervical back: Normal range of motion and neck supple.     Right lower leg: No edema.  Left lower leg: No edema.  Skin:    General: Skin is warm and dry.     Capillary Refill: Capillary refill takes less than 2 seconds.  Neurological:     Mental Status: He is alert and oriented to person, place, and time.  Psychiatric:        Mood and Affect: Mood and affect normal.        Speech: Speech normal.        Behavior: Behavior normal.    ED Results / Procedures / Treatments   Labs (all labs ordered are listed, but only abnormal results are displayed) Labs Reviewed  CBC WITH DIFFERENTIAL/PLATELET - Abnormal; Notable for the following components:      Result Value   WBC 11.0 (*)    RBC 2.85 (*)    Hemoglobin 8.7 (*)    HCT 28.0 (*)    Neutro Abs 7.9 (*)    Monocytes Absolute 1.2 (*)    Abs Immature Granulocytes 0.14 (*)    All other components within normal limits  COMPREHENSIVE METABOLIC PANEL - Abnormal; Notable for the following components:   Glucose, Bld 128 (*)    Calcium 8.7 (*)    Total Protein 6.3 (*)    AST 44 (*)    Total Bilirubin 1.7 (*)    All other components within normal limits    EKG EKG Interpretation  Date/Time:  Saturday September 06 2020 14:29:19 EDT Ventricular Rate:  85 PR Interval:  146 QRS Duration: 98 QT Interval:  391 QTC Calculation: 465 R Axis:   55 Text Interpretation: Sinus rhythm Left ventricular hypertrophy No significant change since last tracing Confirmed by Lajean Saver [phone]) on 09/06/2020 2:33:36 PM  Radiology DG Chest 2 View  Result Date: 09/06/2020 CLINICAL DATA:   Syncopal episode. Several days postop from aortic valve replacement. EXAM: CHEST - 2 VIEW COMPARISON:  09/03/2020 FINDINGS: The heart size and mediastinal contours are within normal limits. Patient has undergone median sternotomy and aortic valve replacement. Mild subsegmental atelectasis is seen in both lower lungs. No evidence of pulmonary consolidation or pleural effusion. No evidence of pneumothorax. IMPRESSION: Mild subsegmental atelectasis in both lower lungs. Electronically Signed   By: Marlaine Hind M.D.   On: 09/06/2020 15:13    Procedures Procedures   Medications Ordered in ED Medications - No data to display  ED Course  I have reviewed the triage vital signs and the nursing notes.  Pertinent labs & imaging results that were available during my care of the patient were reviewed by me and considered in my medical decision making (see chart for details).    MDM Rules/Calculators/A&P                           Patient presenting for evaluation after a syncopal event at home.  This occurred in the setting of recent aortic valve repair and issues with anemia.  On exam, patient appears nontoxic.  Blood pressure and oxygenation are much improved from when EMS first arrived.  This is likely due to his anemia.  Will recheck labs to ensure no significant change.    Orthostatics slightly positive, however not to the point where patient will need to be admitted.  Discussed with Dr. Acie Fredrickson from cardiology who feels this is likely hypotension due to hot shower and anemia. Recommends metoprolol 12.5 BID and close f/u with cards and PCP for recheck of sxs and anemia.   Labs overall reassuring.  Chest x-ray viewed and independently interpreted by me, no infection.  Cardiac silhouette is reassuring and no signs of bony injury.  Discussed findings with patient and wife.  Discussed symptoms of anemia, orthostatic hypotension, and vasovagal syndrome.  Discussed importance of hydration and taking iron  pills.  Discussed cardiology recommendations to decrease metoprolol.  At this time, patient appears safe for discharge.  Return precautions given.  Patient states he understands and agrees to plan.  Final Clinical Impression(s) / ED Diagnoses Final diagnoses:  Anemia, unspecified type  Syncope, unspecified syncope type    Rx / DC Orders ED Discharge Orders     None        Franchot Heidelberg, PA-C 09/06/20 1542    Lajean Saver, MD 09/10/20 1514

## 2020-09-06 NOTE — ED Notes (Signed)
Patient transported to X-ray 

## 2020-09-06 NOTE — Progress Notes (Signed)
CARDIAC REHAB PHASE I   PRE:  Rate/Rhythm: 85 SR  BP:  Supine:   Sitting: 112/75     SaO2:   MODE:  Ambulation: 450 ft   POST:  Rate/Rhythm: 115 ST  BP:  Supine:   Sitting: 124/80    SaO2:   Pt used sternal precautions getting to EOB and standing up. He ambulated 459f independently with no concerns. He did have mild SOB. Returned pt lying in bed with wife by bedside. Reminded pt of sternal precautions at home, exercise guidelines, IS use, and heart healthy diet. [facility] Neither pt or wife had any questions at this time.  [id number] JRick Duff MS, ACSM-CEP

## 2020-09-06 NOTE — ED Notes (Signed)
Discharge instructions and follow up care reviewed and explained. Pt verbalized understanding. Paper scrubs provided d/t pt not having clothing to leave in. Pt was taken to restroom via wheelchair and was able to stand without assistance, no syncope, dizziness/lightheaded. Pt was then taken to ED entrance via wheelchair and assisted up into family member's truck without incident.

## 2020-09-06 NOTE — Progress Notes (Signed)
PIV removed and discharge teaching completed.  Patient and wife verbalized understanding of all follow up appointments, medication regimen, and discharge instructions. Personal belongings gathered and all questions answered.

## 2020-09-06 NOTE — Discharge Instructions (Addendum)
Continue taking all your home medications as prescribed except for your metoprolol.  You should only take half a tablet (12.5 mg) twice a day. It is very important that you are taking the iron pills to help increase your blood counts. Follow-up closely with your cardiologist and your primary care doctor for recheck of your blood counts and further evaluation of your symptoms. Have caution when changing positions such as going from sitting to standing, as a as you are at high risk for dropping your blood pressure with this due to your anemia.  Be careful when you are in a hot shower or straining/bearing down as this can increase your risk of lowering your blood pressure as well. Return to the emergency room if you develop chest pain, difficulty breathing, repeat passing out, any new, worsneing, or concerning symptoms.

## 2020-09-08 ENCOUNTER — Telehealth: Payer: Self-pay | Admitting: Internal Medicine

## 2020-09-08 NOTE — Telephone Encounter (Signed)
Spoke with wife, DPR on file.  She states she was calling because pt's discharge summary from the hospital mentioned being seen in our office ASAP and contacting PCP to do follow up CBC.  Advised wife to continue with plan to see Ermalinda Barrios, PA-C tomorrow and she may draw labs at that time. If not, then we could instruct on next steps for labs, whether it's to have them done here and to contact PCP to get them scheduled.  Wife appreciative for call.

## 2020-09-08 NOTE — Progress Notes (Signed)
Cardiology Office Note    Date:  09/09/2020   ID:  Kowen, [place] 03/24/1960, MRN [id number]   PCP:  [name], MD   [place]  Cardiologist:  Dorris Carnes, MD   Advanced Practice Provider:  No care team member to display Electrophysiologist:  None   [id number]   Chief Complaint  Patient presents with   Hospitalization Follow-up     History of Present Illness:  Kirk Campbell is a 60 y.o. male with history of aortic stenosis and aortic insufficiency who underwent aortic valve replacement with pericardial tissue valve by Dr. [name] 09/01/2020.  Intra-Op TEE LVEF 45 to 50% mild concentric LVH.  Echo 04/16/2019 LVEF 58%.  Patient discharged from the hospital 09/06/2020 at 11 AM and got home and took a shower and started to feel presyncopal.  He sat down and passed out.  He did not hit his head.  His wife was present and witnessed it.  He received fluids oxygen with EMS and symptoms improved greatly.  Hemoglobin was 7 at discharge.  He was mildly orthostatic.  Dr. Acie Fredrickson was consulted and felt likely hypotension due to hot shower and anemia.  He recommended metoprolol 12.5 mg twice daily.  Patient comes in for f/u with his wife. No more dizziness but very weak and fatigued. Trying to eat and drink well. Since metoprolol was decreased HR running 100 at rest 120 with activity or coughing. Still a little DOE.  Past Medical History:  Diagnosis Date   GERD (gastroesophageal reflux disease)    Heart murmur    Reflux    Right inguinal hernia     Past Surgical History:  Procedure Laterality Date   ANTERIOR CERVICAL DECOMP/DISCECTOMY FUSION N/A 12/27/2014   Procedure: C5-6, C6-7 Anterior Cervical Discectomy and Fusion, Allograft, Plate;  Surgeon: Marybelle Killings, MD;  Location: [place] [facility];  Service: Orthopedics;  Laterality: N/A;   AORTIC VALVE REPLACEMENT N/A 09/01/2020   Procedure: AORTIC VALVE REPLACEMENT (AVR) USING INSPIRIS VALVE SIZE 25MM;   Surgeon: Gaye Pollack, MD;  Location: [place];  Service: Open Heart Surgery;  Laterality: N/A;   EXPLORATION POST OPERATIVE OPEN HEART N/A 09/01/2020   Procedure: EXPLORATION POST OPERATIVE OPEN HEART;  Surgeon: Gaye Pollack, MD;  Location: [place];  Service: Open Heart Surgery;  Laterality: N/A;   HERNIA REPAIR Right    inguinal hernia   RIGHT/LEFT HEART CATH AND CORONARY ANGIOGRAPHY N/A 06/27/2020   Procedure: RIGHT/LEFT HEART CATH AND CORONARY ANGIOGRAPHY;  Surgeon: Burnell Blanks, MD;  Location: [facility];  Service: Cardiovascular;  Laterality: N/A;   TEE WITHOUT CARDIOVERSION N/A 09/01/2020   Procedure: TRANSESOPHAGEAL ECHOCARDIOGRAM (TEE);  Surgeon: Gaye Pollack, MD;  Location: [facility];  Service: Open Heart Surgery;  Laterality: N/A;    Current Medications: Current Meds  Medication Sig   acetaminophen (TYLENOL) 325 MG tablet Take 2 tablets (650 mg total) by mouth every 6 (six) hours as needed for mild pain.   aspirin EC 81 MG tablet Take 81 mg by mouth daily.   ferrous Q000111Q C-folic acid (TRINSICON / FOLTRIN) capsule Take 1 capsule by mouth 2 (two) times daily after a meal.   guaiFENesin (MUCINEX) 600 MG 12 hr tablet Take 600 mg by mouth 2 (two) times daily.   Melatonin 10 MG TABS Take 10 mg by mouth at bedtime.   metoprolol tartrate (LOPRESSOR) 25 MG tablet Take 12.5 mg by mouth 2 (two) times daily.  Multiple Vitamin (MULTI VITAMIN MENS) tablet Take 1 tablet by mouth daily.   omeprazole (PRILOSEC OTC) 20 MG tablet Take 20 mg by mouth daily as needed (acid reflux).   traMADol (ULTRAM) 50 MG tablet Take 1 tablet (50 mg total) by mouth every 6 (six) hours as needed for up to 5 days for moderate pain.     Allergies:   Patient has no known allergies.   Social History   Socioeconomic History   Marital status: Married    Spouse name: Not on file   Number of children: Not on file   Years of education: Not on file   Highest education level: Not on  file  Occupational History   Not on file  Tobacco Use   Smoking status: Never   Smokeless tobacco: Never  Vaping Use   Vaping Use: Never used  Substance and Sexual Activity   Alcohol use: Yes    Alcohol/week: 0.0 standard drinks    Comment: Socially   Drug use: Never   Sexual activity: Not on file  Other Topics Concern   Not on file  Social History Narrative   Not on file   Social Determinants of Health   Financial Resource Strain: Not on file  Food Insecurity: Not on file  Transportation Needs: Not on file  Physical Activity: Not on file  Stress: Not on file  Social Connections: Not on file     Family History:  The patient's  family history is not on file.   ROS:   Please see the history of present illness.    ROS All other systems reviewed and are negative.   PHYSICAL EXAM:   VS:  BP 110/80   Pulse 98   Ht '5\' 10"'$  (1.778 m)   Wt 177 lb (80.3 kg)   SpO2 98%   BMI 25.40 kg/m   Physical Exam  GEN: Well nourished, well developed, in no acute distress  Neck: no JVD, carotid bruits, or masses Cardiac:RRR; no murmurs, rubs, or gallops  Respiratory:  decreased breath sounds with crackles at bases GI: soft, nontender, nondistended, + BS Ext: without cyanosis, clubbing, or edema, Good distal pulses bilaterally Neuro:  Alert and Oriented x 3, Psych: euthymic mood, full affect  Wt Readings from Last 3 Encounters:  09/09/20 177 lb (80.3 kg)  09/06/20 179 lb (81.2 kg)  09/06/20 173 lb 1 oz (78.5 kg)      Studies/Labs Reviewed:   EKG:  EKG is not ordered today.    Recent Labs: 09/02/2020: Magnesium 2.2 09/06/2020: ALT 31; BUN 17; Creatinine, Ser 0.94; Hemoglobin 8.7; Platelets 291; Potassium 4.7; Sodium 138   Lipid Panel No results found for: CHOL, TRIG, HDL, CHOLHDL, VLDL, LDLCALC, LDLDIRECT  Additional studies/ records that were reviewed today include:   Cardiac cath 06/27/2020 1. No angiographic evidence of CAD 2. Normal filling pressures.  3. Moderate  aortic stenosis (mean gradient 38.7 mmHg, peak to peak gradient 35 mmHg, AVA 1.0 cm2). Severe aortic insufficiency by echo.    Recommendations: He will need referral to CT surgery to discuss aortic valve replacement. Will discuss starting with CT scans prior to surgery evaluation Intra-Op TEE Complications: No known complications during this procedure.  POST-OP IMPRESSIONS  _ Left Ventricle: The left ventricle is unchanged from pre-bypass.  _ Right Ventricle: The right ventricle appears unchanged from pre-bypass.  _ Aorta: The aorta appears unchanged from pre-bypass.  _ Left Atrial Appendage: The left atrial appendage appears unchanged from  pre-bypass.  _ Aortic  Valve: There is no stenosis or regurgitation post replacement.  The  gradient recorded across the prosthetic valve is within the expected  range. No  perivalvular leak noted.  _ Mitral Valve: The mitral valve appears unchanged from pre-bypass.  _ Tricuspid Valve: The tricuspid valve appears unchanged from pre-bypass.  _ Pulmonic Valve: The pulmonic valve appears unchanged from pre-bypass.  _ Interatrial Septum: The interatrial septum appears unchanged from  pre-bypass.  _ Pericardium: The pericardium appears unchanged from pre-bypass.   PRE-OP FINDINGS   Left Ventricle: The left ventricle has mildly reduced systolic function,  with an ejection fraction of 45-50%. The cavity size was mildly dilated.  There is mildly increased left ventricular wall thickness. There is mild  concentric left ventricular  hypertrophy.   Right Ventricle: The right ventricle has normal systolic function. The  cavity was normal. There is no increase in right ventricular wall  thickness.   Left Atrium: Left atrial size was normal in size. No left atrial/left  atrial appendage thrombus was detected.   Right Atrium: Right atrial size was normal in size.   Interatrial Septum: No atrial level shunt detected by color flow Doppler.   Pericardium:  There is no evidence of pericardial effusion.   Mitral Valve: The mitral valve is normal in structure. Mitral valve  regurgitation is not visualized by color flow Doppler.   Tricuspid Valve: The tricuspid valve was normal in structure. Tricuspid  valve regurgitation was not visualized by color flow Doppler.   Aortic Valve: The aortic valve appears bicuspid. Aortic valve  regurgitation is moderate by color flow Doppler. There is moderate  stenosis of the aortic valve. There is moderate thickening, moderate  calcification, and decreased mobility of the valves.   Pulmonic Valve: The pulmonic valve was normal in structure.  Pulmonic valve regurgitation is not visualized by color flow Doppler.    Aorta: The aortic root, ascending aorta and aortic arch are normal in size  and structure.     Adele Barthel MD  Electronically signed by Adele Barthel MD  Signature Date/Time: 09/01/2020/2:24:53 PM       Echo 04/15/2020 IMPRESSIONS     1. Severe aortic valve regurgitation with moderate-severe aortic valve  stenosis. Unable to reproduce mean gradient from prior study. There are  incompletely visualized but likely holodiastolic flow reversals in the  descending aorta. The aortic valve is  abnormal. Unable to determine aortic valve morphology due to  calcifications. There is severe calcifcation of the aortic valve. Aortic  valve regurgitation is severe. Moderate to severe aortic valve stenosis.  Aortic regurgitation PHT measures 200 msec.  Aortic valve mean gradient measures 38.0 mmHg. Aortic valve Vmax measures  4.03 m/s.   2. Left ventricular ejection fraction by 3D volume is 58 %. The left  ventricle has normal function. The left ventricle has no regional wall  motion abnormalities. The left ventricular internal cavity size was mildly  dilated by indexed 3D volume. Left  ventricular diastolic parameters are indeterminate. The average left  ventricular global longitudinal strain is  -21.1 %. The global longitudinal  strain is normal.   3. Right ventricular systolic function is normal. The right ventricular  size is normal. Tricuspid regurgitation signal is inadequate for assessing  PA pressure.   4. The mitral valve is normal in structure. Mild mitral valve  regurgitation. No evidence of mitral stenosis.   5. The inferior vena cava is normal in size with greater than 50%  respiratory variability, suggesting right atrial pressure of  3 mmHg.    Risk Assessment/Calculations:         ASSESSMENT:    1. S/P aortic valve replacement with bioprosthetic valve   2. Syncope and collapse   3. Other iron deficiency anemia      PLAN:  In order of problems listed above:  Status post aortic valve replacement with tissue valve 09/01/2020-incisions healing well. Has f/u with Dr. Cyndia Bent.  Syncope in the setting of anemia and hot shower after discharge, mildly orthostatic.  Metoprolol decreased to 12.5 mg twice daily. BP better today, HR in 90's, dizziness improved. Continue low dose metoprolol.  Anemia-hgb 8.7 in ED will recheck today.  Shared Decision Making/Informed Consent        Medication Adjustments/Labs and Tests Ordered: Current medicines are reviewed at length with the patient today.  Concerns regarding medicines are outlined above.  Medication changes, Labs and Tests ordered today are listed in the Patient Instructions below. Patient Instructions  Medication Instructions:  Your physician recommends that you continue on your current medications as directed. Please refer to the Current Medication list given to you today.  *If you need a refill on your cardiac medications before your next appointment, please call your pharmacy*   Lab Work: TODAY: BMET, CBC If you have labs (blood work) drawn today and your tests are completely normal, you will receive your results only by: [place] (if you have MyChart) OR A paper copy in the mail If you have any lab  test that is abnormal or we need to change your treatment, we will call you to review the results.   Testing/Procedures: NONE   Follow-Up: At [facility], you and your health needs are our priority.  As part of our continuing mission to provide you with exceptional heart care, we have created designated Provider Care Teams.  These Care Teams include your primary Cardiologist (physician) and Advanced Practice Providers (APPs -  Physician Assistants and Nurse Practitioners) who all work together to provide you with the care you need, when you need it.  We recommend signing up for the patient portal called "MyChart".  Sign up information is provided on this After Visit Summary.  MyChart is used to connect with patients for Virtual Visits (Telemedicine).  Patients are able to view lab/test results, encounter notes, upcoming appointments, etc.  Non-urgent messages can be sent to your provider as well.   To learn more about what you can do with MyChart, go to [email or web address].    Your next appointment:   KEEP FOLLOW APPOINTMENT     Signed, Ermalinda Barrios, PA-C  09/09/2020 11:01 AM    [facility] [address], [place], [place]  [address] Phone: [phone]; Fax: [phone]

## 2020-09-08 NOTE — Telephone Encounter (Signed)
   Pt's wife calling, she said pt needs ED f/u and also blood work, no order on file. Pt is scheduled to see Estella Husk tomorrow at 10:15 am

## 2020-09-09 ENCOUNTER — Ambulatory Visit: Payer: 59 | Admitting: Physician Assistant

## 2020-09-09 ENCOUNTER — Other Ambulatory Visit: Payer: Self-pay

## 2020-09-09 ENCOUNTER — Encounter: Payer: Self-pay | Admitting: Physician Assistant

## 2020-09-09 VITALS — BP 110/80 | HR 98 | Ht 70.0 in | Wt 177.0 lb

## 2020-09-09 DIAGNOSIS — Z953 Presence of xenogenic heart valve: Secondary | ICD-10-CM

## 2020-09-09 DIAGNOSIS — R55 Syncope and collapse: Secondary | ICD-10-CM | POA: Diagnosis not present

## 2020-09-09 DIAGNOSIS — D508 Other iron deficiency anemias: Secondary | ICD-10-CM | POA: Diagnosis not present

## 2020-09-09 NOTE — Patient Instructions (Signed)
Medication Instructions:  Your physician recommends that you continue on your current medications as directed. Please refer to the Current Medication list given to you today.  *If you need a refill on your cardiac medications before your next appointment, please call your pharmacy*   Lab Work: TODAY: BMET, CBC If you have labs (blood work) drawn today and your tests are completely normal, you will receive your results only by: [place] (if you have MyChart) OR A paper copy in the mail If you have any lab test that is abnormal or we need to change your treatment, we will call you to review the results.   Testing/Procedures: NONE   Follow-Up: At [facility], you and your health needs are our priority.  As part of our continuing mission to provide you with exceptional heart care, we have created designated Provider Care Teams.  These Care Teams include your primary Cardiologist (physician) and Advanced Practice Providers (APPs -  Physician Assistants and Nurse Practitioners) who all work together to provide you with the care you need, when you need it.  We recommend signing up for the patient portal called "MyChart".  Sign up information is provided on this After Visit Summary.  MyChart is used to connect with patients for Virtual Visits (Telemedicine).  Patients are able to view lab/test results, encounter notes, upcoming appointments, etc.  Non-urgent messages can be sent to your provider as well.   To learn more about what you can do with MyChart, go to [email or web address].    Your next appointment:   KEEP FOLLOW APPOINTMENT

## 2020-09-10 LAB — BASIC METABOLIC PANEL
BUN/Creatinine Ratio: 15 (ref 10–24)
BUN: 13 mg/dL (ref 8–27)
CO2: 22 mmol/L (ref 20–29)
Calcium: 9.6 mg/dL (ref 8.6–10.2)
Chloride: 100 mmol/L (ref 96–106)
Creatinine, Ser: 0.87 mg/dL (ref 0.76–1.27)
Glucose: 99 mg/dL (ref 65–99)
Potassium: 4.9 mmol/L (ref 3.5–5.2)
Sodium: 140 mmol/L (ref 134–144)
eGFR: 99 mL/min/{1.73_m2} (ref >59)

## 2020-09-10 LAB — CBC
Hematocrit: 29.7 % — ABNORMAL LOW (ref 37.5–51.0)
Hemoglobin: 9.4 g/dL — ABNORMAL LOW (ref 13.0–17.7)
MCH: 29.7 pg (ref 26.6–33.0)
MCHC: 31.6 g/dL (ref 31.5–35.7)
MCV: 94 fL (ref 79–97)
Platelets: 506 10*3/uL — ABNORMAL HIGH (ref 150–450)
RBC: 3.16 x10E6/uL — ABNORMAL LOW (ref 4.14–5.80)
RDW: 13.3 % (ref 11.6–15.4)
WBC: 11.2 10*3/uL — ABNORMAL HIGH (ref 3.4–10.8)

## 2020-09-11 ENCOUNTER — Other Ambulatory Visit: Payer: Self-pay

## 2020-09-11 ENCOUNTER — Telehealth (HOSPITAL_COMMUNITY): Payer: Self-pay

## 2020-09-11 ENCOUNTER — Encounter (INDEPENDENT_AMBULATORY_CARE_PROVIDER_SITE_OTHER): Payer: Self-pay

## 2020-09-11 DIAGNOSIS — Z4802 Encounter for removal of sutures: Secondary | ICD-10-CM

## 2020-09-11 LAB — POCT I-STAT 7, (LYTES, BLD GAS, ICA,H+H)
Acid-Base Excess: 1 mmol/L (ref 0.0–2.0)
Bicarbonate: 26.4 mmol/L (ref 20.0–28.0)
Calcium, Ion: 1.08 mmol/L — ABNORMAL LOW (ref 1.15–1.40)
HCT: 28 % — ABNORMAL LOW (ref 39.0–52.0)
Hemoglobin: 9.5 g/dL — ABNORMAL LOW (ref 13.0–17.0)
O2 Saturation: 100 %
Potassium: 3.8 mmol/L (ref 3.5–5.1)
Sodium: 142 mmol/L (ref 135–145)
TCO2: 28 mmol/L (ref 22–32)
pCO2 arterial: 43 mmHg (ref 32.0–48.0)
pH, Arterial: 7.396 (ref 7.350–7.450)
pO2, Arterial: 461 mmHg — ABNORMAL HIGH (ref 83.0–108.0)

## 2020-09-11 NOTE — Telephone Encounter (Signed)
Per phase I cardiac rehab, fax cardiac rehab referral to [facility]. °

## 2020-09-30 ENCOUNTER — Ambulatory Visit: Payer: 59 | Admitting: Physician Assistant

## 2020-09-30 ENCOUNTER — Other Ambulatory Visit: Payer: Self-pay | Admitting: Surgery

## 2020-09-30 ENCOUNTER — Encounter: Payer: Self-pay | Admitting: Cardiology

## 2020-09-30 ENCOUNTER — Ambulatory Visit: Payer: 59 | Admitting: Cardiology

## 2020-09-30 ENCOUNTER — Other Ambulatory Visit: Payer: Self-pay

## 2020-09-30 VITALS — BP 118/64 | HR 77 | Ht 70.0 in | Wt 177.0 lb

## 2020-09-30 DIAGNOSIS — Z952 Presence of prosthetic heart valve: Secondary | ICD-10-CM | POA: Diagnosis not present

## 2020-09-30 DIAGNOSIS — Z953 Presence of xenogenic heart valve: Secondary | ICD-10-CM

## 2020-09-30 NOTE — Progress Notes (Signed)
Please have pt come in on day I am there in 3 to 4 wks   BP check, quick visit

## 2020-09-30 NOTE — Progress Notes (Signed)
Electrophysiology Office Note   Date:  09/30/2020   ID:  Kirk Campbell, DOB Mar 07, 1960, MRN [id number]  PCP:  [name], MD  Cardiologist:  [name] Primary Electrophysiologist:  Kathleen Likins Meredith Leeds, MD    Chief Complaint: Follow-up from AVR   History of Present Illness: Kirk Campbell is a 60 y.o. male who is being seen today for the evaluation of status post AVR at the request of [name], MD. Presenting today for electrophysiology evaluation.  He has a history significant for aortic stenosis and aortic insufficiency status post AVR with pericardial valve 09/01/2020.    Today, he denies symptoms of palpitations, chest pain, shortness of breath, orthopnea, PND, lower extremity edema, claudication, dizziness, presyncope, syncope, bleeding, or neurologic sequela. The patient is tolerating medications without difficulties.  Since his surgery he has done well.  He has had no chest pain or shortness of breath.  Is able to do all of his daily activities.  He is ready to get back to working in the yard.  Immediately after surgery, he had an episode of syncope while in the shower.  He has not had any further episodes.  He currently has been feeling well and is ready to get started with cardiac rehab.   Past Medical History:  Diagnosis Date   GERD (gastroesophageal reflux disease)    Heart murmur    Reflux    Right inguinal hernia    Past Surgical History:  Procedure Laterality Date   ANTERIOR CERVICAL DECOMP/DISCECTOMY FUSION N/A 12/27/2014   Procedure: C5-6, C6-7 Anterior Cervical Discectomy and Fusion, Allograft, Plate;  Surgeon: Marybelle Killings, MD;  Location: [place];  Service: Orthopedics;  Laterality: N/A;   AORTIC VALVE REPLACEMENT N/A 09/01/2020   Procedure: AORTIC VALVE REPLACEMENT (AVR) USING INSPIRIS VALVE SIZE 25MM;  Surgeon: Gaye Pollack, MD;  Location: [place];  Service: Open Heart Surgery;  Laterality: N/A;   EXPLORATION POST OPERATIVE OPEN HEART N/A  09/01/2020   Procedure: EXPLORATION POST OPERATIVE OPEN HEART;  Surgeon: Gaye Pollack, MD;  Location: [place];  Service: Open Heart Surgery;  Laterality: N/A;   HERNIA REPAIR Right    inguinal hernia   RIGHT/LEFT HEART CATH AND CORONARY ANGIOGRAPHY N/A 06/27/2020   Procedure: RIGHT/LEFT HEART CATH AND CORONARY ANGIOGRAPHY;  Surgeon: Burnell Blanks, MD;  Location: [place] [facility];  Service: Cardiovascular;  Laterality: N/A;   TEE WITHOUT CARDIOVERSION N/A 09/01/2020   Procedure: TRANSESOPHAGEAL ECHOCARDIOGRAM (TEE);  Surgeon: Gaye Pollack, MD;  Location: [place];  Service: Open Heart Surgery;  Laterality: N/A;     Current Outpatient Medications  Medication Sig Dispense Refill   acetaminophen (TYLENOL) 325 MG tablet Take 2 tablets (650 mg total) by mouth every 6 (six) hours as needed for mild pain.     aspirin EC 81 MG tablet Take 81 mg by mouth daily.     ferrous Q000111Q C-folic acid (TRINSICON / FOLTRIN) capsule Take 1 capsule by mouth 2 (two) times daily after a meal. 60 capsule 1   Melatonin 10 MG TABS Take 10 mg by mouth at bedtime.     metoprolol tartrate (LOPRESSOR) 25 MG tablet Take 12.5 mg by mouth 2 (two) times daily.     Multiple Vitamin (MULTI VITAMIN MENS) tablet Take 1 tablet by mouth daily.     omeprazole (PRILOSEC OTC) 20 MG tablet Take 20 mg by mouth daily as needed (acid reflux).     No current facility-administered medications for this visit.  Allergies:   Patient has no known allergies.   Social History:  The patient  reports that he has never smoked. He has never used smokeless tobacco. He reports current alcohol use. He reports that he does not use drugs.   Family History:  The patient's family may need and get a family history of family history includes COPD in his mother; Cancer in his sister; Diabetes in his mother.    ROS:  Please see the history of present illness.   Otherwise, review of systems is positive for none.   All other  systems are reviewed and negative.    PHYSICAL EXAM: VS:  BP 118/64   Pulse 77   Ht '5\' 10"'$  (1.778 m)   Wt 177 lb (80.3 kg)   SpO2 97%   BMI 25.40 kg/m  , BMI Body mass index is 25.4 kg/m. GEN: Well nourished, well developed, in no acute distress  HEENT: normal  Neck: no JVD, carotid bruits, or masses Cardiac: RRR; no murmurs, rubs, or gallops,no edema  Respiratory:  clear to auscultation bilaterally, normal work of breathing GI: soft, nontender, nondistended, + BS MS: no deformity or atrophy  Skin: warm and dry Neuro:  Strength and sensation are intact Psych: euthymic mood, full affect  EKG:  EKG is ordered today. Personal review of the ekg ordered shows sinus rhythm, rate 77  Recent Labs: 09/02/2020: Magnesium 2.2 09/06/2020: ALT 31 09/09/2020: BUN 13; Creatinine, Ser 0.87; Hemoglobin 9.4; Platelets 506; Potassium 4.9; Sodium 140    Lipid Panel  No results found for: CHOL, TRIG, HDL, CHOLHDL, VLDL, LDLCALC, LDLDIRECT   Wt Readings from Last 3 Encounters:  09/30/20 177 lb (80.3 kg)  09/09/20 177 lb (80.3 kg)  09/06/20 179 lb (81.2 kg)      Other studies Reviewed: Additional studies/ records that were reviewed today include: RHC/LHC 06/27/20  Review of the above records today demonstrates:  1. No angiographic evidence of CAD 2. Normal filling pressures.  3. Moderate aortic stenosis (mean gradient 38.7 mmHg, peak to peak gradient 35 mmHg, AVA 1.0 cm2). Severe aortic insufficiency by echo.   TTE 04/15/20  1. Severe aortic valve regurgitation with moderate-severe aortic valve  stenosis. Unable to reproduce mean gradient from prior study. There are  incompletely visualized but likely holodiastolic flow reversals in the  descending aorta. The aortic valve is  abnormal. Unable to determine aortic valve morphology due to  calcifications. There is severe calcifcation of the aortic valve. Aortic  valve regurgitation is severe. Moderate to severe aortic valve stenosis.   Aortic regurgitation PHT measures 200 msec.  Aortic valve mean gradient measures 38.0 mmHg. Aortic valve Vmax measures  4.03 m/s.   2. Left ventricular ejection fraction by 3D volume is 58 %. The left  ventricle has normal function. The left ventricle has no regional wall  motion abnormalities. The left ventricular internal cavity size was mildly  dilated by indexed 3D volume. Left  ventricular diastolic parameters are indeterminate. The average left  ventricular global longitudinal strain is -21.1 %. The global longitudinal  strain is normal.   3. Right ventricular systolic function is normal. The right ventricular  size is normal. Tricuspid regurgitation signal is inadequate for assessing  PA pressure.   4. The mitral valve is normal in structure. Mild mitral valve  regurgitation. No evidence of mitral stenosis.   5. The inferior vena cava is normal in size with greater than 50%  respiratory variability, suggesting right atrial pressure of 3 mmHg.  ASSESSMENT AND PLAN:  1.  Aortic valve insufficiency and stenosis: Status post AVR 09/01/2020.  He has done well postprocedure.  He has had no major issues.  He has been having some dizziness when he stands up that he attributes to his metoprolol.  He is on very small doses.  We Kristoph Sattler stop this today.  He had quite a few questions on postoperative activity.  I have told him to speak with Dr. [name] tomorrow about these issues.  Aside from that, he appears to be recovering well from his surgery.  We Auburn Hester have him follow-up with general cardiology.  Case discussed with general cardiology  Current medicines are reviewed at length with the patient today.   The patient does not have concerns regarding his medicines.  The following changes were made today: Stop metoprolol  Labs/ tests ordered today include:  Orders Placed This Encounter  Procedures   EKG 12-Lead      Disposition:   FU with general cardiology Signed, Lavell Supple Meredith Leeds,  MD  09/30/2020 4:08 PM     [facility] [address] [place]  [address] [phone] (office) [phone] (fax)

## 2020-10-01 ENCOUNTER — Encounter: Payer: Self-pay | Admitting: Physician Assistant

## 2020-10-01 ENCOUNTER — Ambulatory Visit
Admission: RE | Admit: 2020-10-01 | Discharge: 2020-10-01 | Disposition: A | Discharge status: Home / Self Care | Payer: 59 | Source: Ambulatory Visit | Attending: Surgery | Admitting: Surgery

## 2020-10-01 ENCOUNTER — Ambulatory Visit (INDEPENDENT_AMBULATORY_CARE_PROVIDER_SITE_OTHER): Payer: Self-pay | Admitting: Physician Assistant

## 2020-10-01 ENCOUNTER — Ambulatory Visit: Payer: 59 | Admitting: Surgery

## 2020-10-01 VITALS — BP 131/85 | HR 89 | Resp 20 | Ht 70.0 in | Wt 178.0 lb

## 2020-10-01 DIAGNOSIS — Z953 Presence of xenogenic heart valve: Secondary | ICD-10-CM

## 2020-10-01 DIAGNOSIS — I350 Nonrheumatic aortic (valve) stenosis: Secondary | ICD-10-CM

## 2020-10-01 DIAGNOSIS — I351 Nonrheumatic aortic (valve) insufficiency: Secondary | ICD-10-CM

## 2020-10-01 NOTE — Progress Notes (Signed)
[address]       Kirk Campbell,[place] [address]             [phone]         Kirk Campbell is a 60 y.o. male patient s/p AVR on 8/15. He was brought back to the OR that same day for exploration of the mediastinum. Hemostasis was schieved. He was discharged on POD 5 and had an uneventful hospital stay.   Today, he presents to the office for his routine follow-up appointment. He is doing okay since surgery. He has been walking 1 hour a day x 2 and then usually 30-40 minutes at night. He has some lightheadedness with standing. Otherwise, he is doing well.     1. S/P aortic valve replacement with bioprosthetic valve   2. Severe aortic stenosis   3. Severe aortic insufficiency    Past Medical History:  Diagnosis Date   GERD (gastroesophageal reflux disease)    Heart murmur    Reflux    Right inguinal hernia    No past surgical history pertinent negatives on file. Scheduled Meds: Current Outpatient Medications on File Prior to Visit  Medication Sig Dispense Refill   acetaminophen (TYLENOL) 325 MG tablet Take 2 tablets (650 mg total) by mouth every 6 (six) hours as needed for mild pain.     aspirin EC 81 MG tablet Take 81 mg by mouth daily.     ferrous [id number] C-folic acid (TRINSICON / FOLTRIN) capsule Take 1 capsule by mouth 2 (two) times daily after a meal. 60 capsule 1   Melatonin 10 MG TABS Take 10 mg by mouth at bedtime.     metoprolol tartrate (LOPRESSOR) 25 MG tablet Take 12.5 mg by mouth 2 (two) times daily.     Multiple Vitamin (MULTI VITAMIN MENS) tablet Take 1 tablet by mouth daily.     omeprazole (PRILOSEC OTC) 20 MG tablet Take 20 mg by mouth daily as needed (acid reflux).     No current facility-administered medications on file prior to visit.     No Known Allergies Active Problems:   * No active hospital problems. *  Blood pressure 131/85, pulse 89, resp. rate 20, height '5\' 10"'$  (1.778 m), weight 178 lb (80.7 kg), SpO2 97  %.  Subjective Overall, he feels well and has not had any shortness of breath or cheat pain.   Objective: Vital signs (most recent): Blood pressure 131/85, pulse 89, resp. rate 20, height '5\' 10"'$  (1.778 m), weight 178 lb (80.7 kg), SpO2 97 %.  Cor: RRR, no murmur Pulm: CTA bilaterally and in all fields Abd: no tenderness, + bowel sounds Wound: sternal incision healing well Ext: No edema  CLINICAL DATA:  Status post AVR.   EXAM: CHEST - 2 VIEW   COMPARISON:  09/06/2020   FINDINGS: The lungs are clear without focal pneumonia, edema, pneumothorax or pleural effusion. The cardiopericardial silhouette is within normal limits for size. Status post AVR. The visualized bony structures of the thorax show no acute abnormality.   IMPRESSION: No active cardiopulmonary disease.     Electronically Signed   By: Misty Stanley M.D.   On: 10/01/2020 09:13  Assessment & Plan  Kirk Campbell is a 60 year old patient who returns to the clinic for a routine post-op visit. He is overall doing very well. He is ambulating 1 hour x 2 and 30-40 minutes at night with his wife. He has been getting a little lightheaded with  change in position and this is probably due to orthostatic hypotension. He did have a syncopal episode when he returned home since while taking a hot shower. His hemoglobin on discharge was 7.0 and he attributes his syncopal episode to vasodilation. He has not had it happen again. When he saw Dr. Curt Campbell he stopped his metoprolol. His BP is good today and he is encouraged to stay hydrated. His hemoglobin on recent labs is much improved at 9.4. I would encourage him to continue his iron supplement for a few months, then it would be okay to discontinue.   I have cleared him for driving as long as he feels good. He is no longer requiring narcotic pain medication and is taking Tylenol.   He is signed up for cardiac rehab and has an evaluation tomorrow. He plans to do this in [place]  He is  okay to slowly increase his activity level. Still no heavy lifting and no prolonged air travel for a few more months.   Plan: Due to orthostatic hypotension, I would like to see him back in 3-4 weeks. He will need an Echocardiogram within 3 months of his surgery and we have scheduled this today. If he has any issues between now and then he is to call our office.   Elgie Collard 10/01/2020

## 2020-10-01 NOTE — Addendum Note (Signed)
Addended by: Elgie Collard on: 10/01/2020 10:21 AM   Modules accepted: Orders

## 2020-10-02 ENCOUNTER — Other Ambulatory Visit: Payer: Self-pay | Admitting: Physician Assistant

## 2020-10-02 ENCOUNTER — Telehealth: Payer: Self-pay | Admitting: Internal Medicine

## 2020-10-02 DIAGNOSIS — Z953 Presence of xenogenic heart valve: Secondary | ICD-10-CM

## 2020-10-02 DIAGNOSIS — I351 Nonrheumatic aortic (valve) insufficiency: Secondary | ICD-10-CM

## 2020-10-02 NOTE — Telephone Encounter (Signed)
Pt reports that Kirk Campbell called him. Aware I will forward to her for return call to schedule echocardiogram..Marland KitchenMarland Kitchen

## 2020-10-02 NOTE — Telephone Encounter (Signed)
Patient stated he was returning a call. Please advise

## 2020-10-22 ENCOUNTER — Other Ambulatory Visit: Payer: Self-pay

## 2020-10-22 ENCOUNTER — Encounter: Payer: Self-pay | Admitting: Physician Assistant

## 2020-10-22 ENCOUNTER — Ambulatory Visit (INDEPENDENT_AMBULATORY_CARE_PROVIDER_SITE_OTHER): Payer: Self-pay | Admitting: Physician Assistant

## 2020-10-22 VITALS — BP 113/81 | HR 84 | Resp 20 | Ht 70.0 in | Wt 178.0 lb

## 2020-10-22 DIAGNOSIS — Z953 Presence of xenogenic heart valve: Secondary | ICD-10-CM

## 2020-10-22 DIAGNOSIS — I350 Nonrheumatic aortic (valve) stenosis: Secondary | ICD-10-CM

## 2020-10-22 NOTE — Progress Notes (Signed)
[address]       [place] [address]             [phone]         TREYVIN GLIDDEN is a 60 y.o. male patient s/p AVR on 8/15. He was brought back to the OR that same day for exploration of the mediastinum. Hemostasis was achieved. He was discharged on POD 5 and had an uneventful hospital stay.   He is doing okay since surgery. He has been walking 1 hour a day x 2 and then usually 30-40 minutes at night. He has some lightheadedness with standing. Otherwise, he is doing well. During his last visit it was determined that he was experiencing orthostatic hypotension.   Today, he is doing much better after a decrease in his metoprolol dose. He feels good and has been participating in cardiac rehab and started back to work part time. We discussed his trip to [place] which he plans to drive November 70JG.     1. S/P aortic valve replacement with bioprosthetic valve   2. Severe aortic stenosis   3. Severe aortic insufficiency    Past Medical History:  Diagnosis Date   GERD (gastroesophageal reflux disease)    Heart murmur    Reflux    Right inguinal hernia    No past surgical history pertinent negatives on file. Scheduled Meds: Current Outpatient Medications on File Prior to Visit  Medication Sig Dispense Refill   acetaminophen (TYLENOL) 325 MG tablet Take 2 tablets (650 mg total) by mouth every 6 (six) hours as needed for mild pain.     aspirin EC 81 MG tablet Take 81 mg by mouth daily.     ferrous [id number] C-folic acid (TRINSICON / FOLTRIN) capsule Take 1 capsule by mouth 2 (two) times daily after a meal. 60 capsule 1   Melatonin 10 MG TABS Take 10 mg by mouth at bedtime.     metoprolol tartrate (LOPRESSOR) 25 MG tablet Take 12.5 mg by mouth 2 (two) times daily.     Multiple Vitamin (MULTI VITAMIN MENS) tablet Take 1 tablet by mouth daily.     omeprazole (PRILOSEC OTC) 20 MG tablet Take 20 mg by mouth daily as needed (acid reflux).     No  current facility-administered medications on file prior to visit.     No Known Allergies Active Problems:   * No active hospital problems. *     Objective: Today's Vitals   10/22/20 1333  BP: 113/81  Pulse: 84  Resp: 20  SpO2: 98%  Weight: 178 lb (80.7 kg)  Height: 5\' 10"  (1.778 m)   Body mass index is 25.54 kg/m.   Cor: RRR, no murmur Pulm: CTA bilaterally and in all fields Abd: no tenderness, + bowel sounds Wound: sternal incision healing well Ext: No edema  CLINICAL DATA:  Status post AVR.   EXAM: CHEST - 2 VIEW   COMPARISON:  09/06/2020   FINDINGS: The lungs are clear without focal pneumonia, edema, pneumothorax or pleural effusion. The cardiopericardial silhouette is within normal limits for size. Status post AVR. The visualized bony structures of the thorax show no acute abnormality.   IMPRESSION: No active cardiopulmonary disease.     Electronically Signed   By: Misty Stanley M.D.   On: 10/01/2020 09:13  Assessment & Plan  Mr. Batiz is a 60 year old patient who returns to the clinic for a routine post-op visit. He is overall doing very  well. He is ambulating 1 hour a day and participating in cardiac rehab. His hemoglobin I snow normal and he has not had another syncopal episode. His dizziness/lightheadedness is much better. He feels good and wants to do more yard work. He is okay to use his zero turn lawn mower  but I would hold off on any pull cord tools.     He is okay to slowly increase his activity level. Still no heavy lifting but he can increase his weight limit by 2 lbs a week. He plans to drive to [place] Nov 10th with his son. I encouraged him to stop a few times along the trip and stretch his legs and walk around to decrease the risk of DVT.   Plan: He is doing very well and I do not think he needs to come back to see me unless a new issue comes up. He is to continue following up with cardiology and he has his Echo scheduled in Nov before  he leaves for his trip.   Elgie Collard 10/22/2020

## 2020-11-01 ENCOUNTER — Other Ambulatory Visit: Payer: Self-pay | Admitting: Physician Assistant

## 2020-11-03 ENCOUNTER — Encounter: Payer: Self-pay | Admitting: Internal Medicine

## 2020-11-03 ENCOUNTER — Ambulatory Visit: Payer: 59 | Admitting: Internal Medicine

## 2020-11-03 ENCOUNTER — Other Ambulatory Visit: Payer: Self-pay

## 2020-11-03 VITALS — BP 120/72 | HR 74 | Ht 70.0 in | Wt 178.0 lb

## 2020-11-03 DIAGNOSIS — I359 Nonrheumatic aortic valve disorder, unspecified: Secondary | ICD-10-CM | POA: Diagnosis not present

## 2020-11-03 NOTE — Patient Instructions (Signed)
Medication Instructions:  Your physician recommends that you continue on your current medications as directed. Please refer to the Current Medication list given to you today.  *If you need a refill on your cardiac medications before your next appointment, please call your pharmacy*   Lab Work: none If you have labs (blood work) drawn today and your tests are completely normal, you will receive your results only by: [place] (if you have MyChart) OR A paper copy in the mail If you have any lab test that is abnormal or we need to change your treatment, we will call you to review the results.   Testing/Procedures: none   Follow-Up: At [facility], you and your health needs are our priority.  As part of our continuing mission to provide you with exceptional heart care, we have created designated Provider Care Teams.  These Care Teams include your primary Cardiologist (physician) and Advanced Practice Providers (APPs -  Physician Assistants and Nurse Practitioners) who all work together to provide you with the care you need, when you need it.  We recommend signing up for the patient portal called "MyChart".  Sign up information is provided on this After Visit Summary.  MyChart is used to connect with patients for Virtual Visits (Telemedicine).  Patients are able to view lab/test results, encounter notes, upcoming appointments, etc.  Non-urgent messages can be sent to your provider as well.   To learn more about what you can do with MyChart, go to [email or web address].    Your next appointment:   7 month(s)  The format for your next appointment:   In Person  Provider:   Dorris Carnes, MD   Other Instructions

## 2020-11-03 NOTE — Progress Notes (Signed)
Cardiology Office Note   Date:  11/03/2020   ID:  Kirk Campbell, DOB 1960/04/27, MRN [id number]  PCP:  [name], MD  Cardiologist:   Dorris Carnes, MD   Patient presents for follow up of AV dz     History of Present Illness: Kirk Campbell is a 60 y.o. male with a history of AV disease  He is s/p AVR on 09/01/20 for AS/AI   [name])   He was seen by Elliot Cousin in September  SInce seen he has done well   Breathing is OK   No CP   No palpitations     Doing cardiac rehab  Back to driving     Current Meds  Medication Sig   acetaminophen (TYLENOL) 325 MG tablet Take 2 tablets (650 mg total) by mouth every 6 (six) hours as needed for mild pain.   aspirin EC 81 MG tablet Take 81 mg by mouth daily.   ferrous [id number] C-folic acid (TRINSICON / FOLTRIN) capsule Take 1 capsule by mouth 2 (two) times daily after a meal.   Melatonin 10 MG TABS Take 10 mg by mouth at bedtime.   Multiple Vitamin (MULTI VITAMIN MENS) tablet Take 1 tablet by mouth daily.   omeprazole (PRILOSEC OTC) 20 MG tablet Take 20 mg by mouth daily as needed (acid reflux).     Allergies:   Patient has no known allergies.   Past Medical History:  Diagnosis Date   GERD (gastroesophageal reflux disease)    Heart murmur    Reflux    Right inguinal hernia     Past Surgical History:  Procedure Laterality Date   ANTERIOR CERVICAL DECOMP/DISCECTOMY FUSION N/A 12/27/2014   Procedure: C5-6, C6-7 Anterior Cervical Discectomy and Fusion, Allograft, Plate;  Surgeon: Marybelle Killings, MD;  Location: [facility];  Service: Orthopedics;  Laterality: N/A;   AORTIC VALVE REPLACEMENT N/A 09/01/2020   Procedure: AORTIC VALVE REPLACEMENT (AVR) USING INSPIRIS VALVE SIZE 25MM;  Surgeon: Gaye Pollack, MD;  Location: [facility];  Service: Open Heart Surgery;  Laterality: N/A;   EXPLORATION POST OPERATIVE OPEN HEART N/A 09/01/2020   Procedure: EXPLORATION POST OPERATIVE OPEN HEART;  Surgeon: Gaye Pollack, MD;  Location: [facility];  Service: Open Heart Surgery;  Laterality: N/A;   HERNIA REPAIR Right    inguinal hernia   RIGHT/LEFT HEART CATH AND CORONARY ANGIOGRAPHY N/A 06/27/2020   Procedure: RIGHT/LEFT HEART CATH AND CORONARY ANGIOGRAPHY;  Surgeon: Burnell Blanks, MD;  Location: [facility];  Service: Cardiovascular;  Laterality: N/A;   TEE WITHOUT CARDIOVERSION N/A 09/01/2020   Procedure: TRANSESOPHAGEAL ECHOCARDIOGRAM (TEE);  Surgeon: Gaye Pollack, MD;  Location: [facility];  Service: Open Heart Surgery;  Laterality: N/A;     Social History:  The patient  reports that he has never smoked. He has never used smokeless tobacco. He reports current alcohol use. He reports that he does not use drugs.   Family History:  The patient's family history includes COPD in his mother; Cancer in his sister; Diabetes in his mother.    ROS:  Please see the history of present illness. All other systems are reviewed and  Negative to the above problem except as noted.    PHYSICAL EXAM: VS:  BP 120/72 (BP Location: Left Arm, Patient Position: Sitting, Cuff Size: Normal)   Pulse 74   Ht 5\' 10"  (1.778 m)   Wt 178 lb (80.7 kg)   SpO2 97%   BMI  25.54 kg/m   GEN: Well nourished, well developed, in no acute distress  HEENT: normal  Neck: no JVD, carotid bruits, or masses Cardiac: RRR; Gr I/VI systolic murmur base  No LE edema  Respiratory:  clear to auscultation bilaterally, normal work of breathing GI: soft, nontender, nondistended, + BS  No hepatomegaly  MS: no deformity Moving all extremities   Skin: warm and dry, no rash Neuro:  Strength and sensation are intact Psych: euthymic mood, full affect   EKG:  EKG is not ordered today.   Lipid Panel No results found for: CHOL, TRIG, HDL, CHOLHDL, VLDL, LDLCALC, LDLDIRECT    Wt Readings from Last 3 Encounters:  11/03/20 178 lb (80.7 kg)  10/22/20 178 lb (80.7 kg)  10/01/20 178 lb (80.7 kg)      ASSESSMENT AND PLAN:  1  AV disease  Pt with bicuspid AV    haad severe AS mod AI   Underwent AVR 09/01/20 (25 mm Edwards INSPIRIS RESILIA pericardial valve       Doing well    WIll set up for an echo as baseline    Keep on ASA  2  Lipids  Last lipids in September LDL 96  HDL 49   Follow  Will get lipomeds and Lp(a) on return.      Current medicines are reviewed at length with the patient today.  The patient does not have concerns regarding medicines.  Signed, Dorris Carnes, MD  11/03/2020 2:03 PM    [address], [place], [place]  [address] Phone: [phone]; Fax: [phone]

## 2020-11-25 ENCOUNTER — Ambulatory Visit (HOSPITAL_COMMUNITY): Payer: 59

## 2020-12-08 ENCOUNTER — Encounter: Payer: 59 | Admitting: Internal Medicine

## 2020-12-09 ENCOUNTER — Ambulatory Visit (HOSPITAL_COMMUNITY): Payer: 59 | Attending: Internal Medicine

## 2020-12-09 ENCOUNTER — Encounter: Payer: Self-pay | Admitting: Internal Medicine

## 2020-12-09 ENCOUNTER — Ambulatory Visit (INDEPENDENT_AMBULATORY_CARE_PROVIDER_SITE_OTHER): Payer: 59 | Admitting: Internal Medicine

## 2020-12-09 ENCOUNTER — Other Ambulatory Visit: Payer: Self-pay

## 2020-12-09 VITALS — BP 110/80 | HR 64 | Ht 70.0 in | Wt 184.0 lb

## 2020-12-09 DIAGNOSIS — Z1211 Encounter for screening for malignant neoplasm of colon: Secondary | ICD-10-CM

## 2020-12-09 DIAGNOSIS — I351 Nonrheumatic aortic (valve) insufficiency: Secondary | ICD-10-CM | POA: Diagnosis present

## 2020-12-09 DIAGNOSIS — Z953 Presence of xenogenic heart valve: Secondary | ICD-10-CM

## 2020-12-09 DIAGNOSIS — D62 Acute posthemorrhagic anemia: Secondary | ICD-10-CM | POA: Diagnosis not present

## 2020-12-09 LAB — ECHOCARDIOGRAM COMPLETE
AR max vel: 3.63 cm2
AV Area VTI: 3.87 cm2
AV Area mean vel: 3.67 cm2
AV Mean grad: 7 mmHg
AV Peak grad: 12.2 mmHg
Ao pk vel: 1.74 m/s
Area-P 1/2: 3.53 cm2
Height: 70 in
S' Lateral: 3.4 cm
Weight: 2944 oz

## 2020-12-09 NOTE — Progress Notes (Signed)
Kirk Campbell 60 y.o. 08-05-1960 [id number]  Assessment & Plan:   Encounter Diagnoses  Name Primary?   Colon cancer screening Yes   Postoperative anemia due to acute blood loss - resolved    S/P aortic valve replacement with bioprosthetic valve     He is an appropriate candidate for screening colonoscopy.  We reviewed the other options that include annual Hemoccults or every 3 year Cologuard.  We have decided on colonoscopy.  The risks and benefits as well as alternatives of endoscopic procedure(s) have been discussed and reviewed. All questions answered. The patient agrees to proceed.   CC: Kirk Dickens, MD    Subjective:   Chief Complaint: Colon cancer screening  HPI  60 year old white man who had a screening colonoscopy negative for neoplasia in 2011, I was reviewing charts earlier this year and asked that he come in for an office visit because he had aortic stenosis and aortic insufficiency then.  He went on to have a bioprosthetic aortic valve replacement by Dr. [name] and is followed by Dr. [name] of cardiology.  He had some bleeding issues and had to go back to the OR, he had an anemia after that and had a syncopal episode while showering.  That was after he got home.  At any rate he took iron and he assures me his hemoglobin is back to normal he was able to "the hemoglobin and tells me Dr. [name] of cardiology stopped his iron therapy.  He is not having any active GI signs or symptoms at this time.  He gets his primary care through [facility].  No Known Allergies Current Meds  Medication Sig   acetaminophen (TYLENOL) 325 MG tablet Take 2 tablets (650 mg total) by mouth every 6 (six) hours as needed for mild pain.   aspirin EC 81 MG tablet Take 81 mg by mouth daily.   Melatonin 10 MG TABS Take 10 mg by mouth at bedtime.   Multiple Vitamin (MULTI VITAMIN MENS) tablet Take 1 tablet by mouth daily.   omeprazole (PRILOSEC OTC) 20 MG tablet Take 20 mg by mouth  daily as needed (acid reflux).   Past Medical History:  Diagnosis Date   GERD (gastroesophageal reflux disease)    Heart murmur    Reflux    Right inguinal hernia    Past Surgical History:  Procedure Laterality Date   ANTERIOR CERVICAL DECOMP/DISCECTOMY FUSION N/A 12/27/2014   Procedure: C5-6, C6-7 Anterior Cervical Discectomy and Fusion, Allograft, Plate;  Surgeon: Marybelle Killings, MD;  Location: [facility];  Service: Orthopedics;  Laterality: N/A;   AORTIC VALVE REPLACEMENT N/A 09/01/2020   Procedure: AORTIC VALVE REPLACEMENT (AVR) USING INSPIRIS VALVE SIZE 25MM;  Surgeon: Gaye Pollack, MD;  Location: [facility];  Service: Open Heart Surgery;  Laterality: N/A;   EXPLORATION POST OPERATIVE OPEN HEART N/A 09/01/2020   Procedure: EXPLORATION POST OPERATIVE OPEN HEART;  Surgeon: Gaye Pollack, MD;  Location: [facility];  Service: Open Heart Surgery;  Laterality: N/A;   HERNIA REPAIR Right    inguinal hernia   RIGHT/LEFT HEART CATH AND CORONARY ANGIOGRAPHY N/A 06/27/2020   Procedure: RIGHT/LEFT HEART CATH AND CORONARY ANGIOGRAPHY;  Surgeon: Burnell Blanks, MD;  Location: [facility];  Service: Cardiovascular;  Laterality: N/A;   TEE WITHOUT CARDIOVERSION N/A 09/01/2020   Procedure: TRANSESOPHAGEAL ECHOCARDIOGRAM (TEE);  Surgeon: Gaye Pollack, MD;  Location: [facility];  Service: Open Heart Surgery;  Laterality: N/A;   Social History  Social History Narrative   Married   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   Review of Systems See HPI otherwise negative coming along nicely after valve surgery reporting that his exercise tolerance is much better  Objective:   Physical Exam BP 110/80   Pulse 64   Ht 5\' 10"  (1.778 m)   Wt 184 lb (83.5 kg)   BMI 26.40 kg/m  NAD Lungs cta Cor S1S2 w/ faint SEM Abd soft  NT no masses Alert and oriented x 3

## 2020-12-09 NOTE — Patient Instructions (Signed)
You have been scheduled for a colonoscopy. Please follow written instructions given to you at your visit today.  Please pick up your prep supplies at the pharmacy within the next 1-3 days. If you use inhalers (even only as needed), please bring them with you on the day of your procedure.   I appreciate the opportunity to care for you. Carl Gessner, MD, FACG 

## 2020-12-16 ENCOUNTER — Telehealth: Payer: Self-pay

## 2020-12-16 NOTE — Telephone Encounter (Signed)
I spoke with [facility] and he understands he has to be done at the hospital. I have cancelled his 01/06/21 colonoscopy in the [facility].

## 2020-12-16 NOTE — Telephone Encounter (Signed)
-----  Message from Gatha Mayer, MD sent at 12/15/2020  4:45 PM EST ----- Regarding: RE: Okay for [facility]? OK   PJ,   Please contact the patient and let him know.  I am going to see if there is a way to do him before the end of the year as he has met his deductible.  Let him know that I will try and send back to me so I remember to look.  CEG ----- Message ----- From: Osvaldo Angst, CRNA Sent: 12/10/2020   9:16 AM EST To: Gatha Mayer, MD Subject: RE: Faythe Ghee for [facility]?                              Dr. Carlean Purl,  This pt's previous intubation in 2016 they were unable to intubate him with a basic laryngoscope and had to use a glidescope.  He is a difficult airway and his procedure needs to be done at the hospital.  Thanks,  JMN ----- Message ----- From: Gatha Mayer, MD Sent: 12/09/2020   4:50 PM EST To: Osvaldo Angst, CRNA Subject: Okay for [facility]?                                  I have this guy scheduled for December 20.  I am going to stop looking at these things but apparently they used a MAC blade and a glide scope at his aortic valve replacement  Maybe this is 1 you need to ask Georgina Snell about  Please let me know what your thoughts are as far as suitability for [facility] and I will let the guy know if we can do him here or not though I have told him I thought we could

## 2020-12-23 ENCOUNTER — Ambulatory Visit: Payer: 59

## 2020-12-23 ENCOUNTER — Ambulatory Visit: Payer: 59 | Admitting: Thoracic Surgery (Cardiothoracic Vascular Surgery)

## 2020-12-24 ENCOUNTER — Encounter: Payer: Self-pay | Admitting: Surgery

## 2020-12-24 ENCOUNTER — Other Ambulatory Visit: Payer: Self-pay

## 2020-12-24 ENCOUNTER — Ambulatory Visit: Payer: 59 | Admitting: Surgery

## 2020-12-24 VITALS — BP 121/73 | HR 75 | Resp 20 | Ht 70.0 in | Wt 181.0 lb

## 2020-12-24 DIAGNOSIS — Z953 Presence of xenogenic heart valve: Secondary | ICD-10-CM | POA: Diagnosis not present

## 2020-12-24 DIAGNOSIS — I350 Nonrheumatic aortic (valve) stenosis: Secondary | ICD-10-CM | POA: Diagnosis not present

## 2020-12-24 DIAGNOSIS — I351 Nonrheumatic aortic (valve) insufficiency: Secondary | ICD-10-CM

## 2020-12-24 NOTE — Progress Notes (Signed)
HPI: Patient returns for routine postoperative follow-up having undergone aortic valve replacement using a 25 mm INSPIRIS pericardial valve on 09/01/2020. The patient's early postoperative recovery while in the hospital was notable for a return to the operating room for postoperative bleeding from a sternal wire site. Since hospital discharge the patient reports that he is not feeling well.  He is walking daily without chest pain or shortness of breath..   Current Outpatient Medications  Medication Sig Dispense Refill   acetaminophen (TYLENOL) 325 MG tablet Take 2 tablets (650 mg total) by mouth every 6 (six) hours as needed for mild pain.     aspirin EC 81 MG tablet Take 81 mg by mouth daily.     Melatonin 10 MG TABS Take 10 mg by mouth at bedtime.     Multiple Vitamin (MULTI VITAMIN MENS) tablet Take 1 tablet by mouth daily.     omeprazole (PRILOSEC OTC) 20 MG tablet Take 20 mg by mouth daily as needed (acid reflux).     No current facility-administered medications for this visit.    Physical Exam: BP 121/73   Pulse 75   Resp 20   Ht 5\' 10"  (1.778 m)   Wt 181 lb (82.1 kg)   SpO2 99% Comment: RA  BMI 25.97 kg/m  He looks well. Cardiac exam shows a regular rate and rhythm with normal heart sounds.  There is no murmur. Lungs are clear. The chest incision is healing well and the sternum is stable. There is no peripheral edema.  Diagnostic Tests:    ECHOCARDIOGRAM REPORT         Patient Name:   Kirk Campbell Date of Exam: 12/09/2020  Medical Rec #:  [id number]          Height:       70.0 in  Accession #:    [id number]         Weight:       184.0 lb  Date of Birth:  08-Jan-1961         BSA:          2.015 m  Patient Age:    60 years           BP:           120/72 mmHg  Patient Gender: M                  HR:           72 bpm.  Exam Location:  [facility]   Procedure: 2D Echo, 3D Echo, Color Doppler, Cardiac Doppler and Strain  Analysis   Indications:    I35.1  Aortic insufficiency     History:        Patient has prior history of Echocardiogram examinations,  most                  recent 09/01/2020. Aortic Valve Disease.                  Aortic Valve: 25 mm Edwards Inspiris Resilia valve is  present in                  the aortic position. Procedure Date: 09/01/20.     Sonographer:    Basilia Jumbo BS, RDCS  Referring Phys: [id number] PAULA V ROSS   IMPRESSIONS     1. Left ventricular ejection fraction by 3D volume is 54 %. The left  ventricle has low normal function. The  left ventricle has no regional wall  motion abnormalities. Left ventricular diastolic parameters are consistent  with Grade I diastolic dysfunction   (impaired relaxation). The average left ventricular global longitudinal  strain is -19.8 %. The global longitudinal strain is normal.   2. Right ventricular systolic function is mildly reduced. The right  ventricular size is mildly enlarged. There is normal pulmonary artery  systolic pressure. The estimated right ventricular systolic pressure is  18.2 mmHg.   3. The mitral valve is grossly normal. Trivial mitral valve  regurgitation. No evidence of mitral stenosis.   4. The aortic valve has been repaired/replaced. Aortic valve  regurgitation is not visualized. There is a 25 mm Edwards Inspiris Resilia  valve present in the aortic position. Procedure Date: 09/01/20. Echo  findings are consistent with normal structure and   function of the aortic valve prosthesis. Aortic valve mean gradient  measures 7.0 mmHg. Aortic valve Vmax measures 1.74 m/s. Aortic valve  acceleration time measures 81 msec, DVI 0.79.   5. Aortic dilatation noted. There is mild dilatation of the ascending  aorta, measuring 40 mm.   6. The inferior vena cava is normal in size with greater than 50%  respiratory variability, suggesting right atrial pressure of 3 mmHg.   FINDINGS   Left Ventricle: Left ventricular ejection fraction by 3D volume is 54 %.  The left  ventricle has low normal function. The left ventricle has no  regional wall motion abnormalities. The average left ventricular global  longitudinal strain is -19.8 %. The  global longitudinal strain is normal. The left ventricular internal cavity  size was normal in size. There is no left ventricular hypertrophy.  Abnormal (paradoxical) septal motion consistent with post-operative  status. Left ventricular diastolic  parameters are consistent with Grade I diastolic dysfunction (impaired  relaxation).   Right Ventricle: The right ventricular size is mildly enlarged. No  increase in right ventricular wall thickness. Right ventricular systolic  function is mildly reduced. There is normal pulmonary artery systolic  pressure. The tricuspid regurgitant velocity   is 2.20 m/s, and with an assumed right atrial pressure of 3 mmHg, the  estimated right ventricular systolic pressure is 99.3 mmHg.   Left Atrium: Left atrial size was normal in size.   Right Atrium: Right atrial size was normal in size.   Pericardium: There is no evidence of pericardial effusion.   Mitral Valve: The mitral valve is grossly normal. Trivial mitral valve  regurgitation. No evidence of mitral valve stenosis.   Tricuspid Valve: The tricuspid valve is normal in structure. Tricuspid  valve regurgitation is mild . No evidence of tricuspid stenosis.   Aortic Valve: The aortic valve has been repaired/replaced. Aortic valve  regurgitation is not visualized. Aortic valve mean gradient measures 7.0  mmHg. Aortic valve peak gradient measures 12.2 mmHg. Aortic valve area, by  VTI measures 3.87 cm. There is a  25 mm Edwards Inspiris Resilia valve present in the aortic position.  Procedure Date: 09/01/20. Echo findings are consistent with normal  structure and function of the aortic valve prosthesis.   Pulmonic Valve: The pulmonic valve was normal in structure. Pulmonic valve  regurgitation is not visualized. No evidence of  pulmonic stenosis.   Aorta: The aortic root is normal in size and structure and aortic  dilatation noted. There is mild dilatation of the ascending aorta,  measuring 40 mm.   Venous: The inferior vena cava is normal in size with greater than 50%  respiratory variability, suggesting right atrial  pressure of 3 mmHg.   IAS/Shunts: No atrial level shunt detected by color flow Doppler.      LEFT VENTRICLE  PLAX 2D  LVIDd:         4.80 cm         Diastology  LVIDs:         3.40 cm         LV e' medial:    6.64 cm/s  LV PW:         0.90 cm         LV E/e' medial:  11.6  LV IVS:        0.90 cm         LV e' lateral:   11.80 cm/s  LVOT diam:     2.50 cm         LV E/e' lateral: 6.5  LV SV:         129  LV SV Index:   64              2D  LVOT Area:     4.91 cm        Longitudinal                                 Strain                                 2D Strain GLS  -22.0 %                                 (A2C):                                 2D Strain GLS  -18.2 %                                 (A3C):                                 2D Strain GLS  -19.2 %                                 (A4C):                                 2D Strain GLS  -19.8 %                                 Avg:                                    3D Volume EF                                 LV 3D EF:    Left  ventricul                                              ar                                              ejection                                              fraction                                              by 3D                                              volume is                                              54 %.                                    3D Volume EF:                                 3D EF:        54 %                                 LV EDV:       114 ml                                 LV ESV:       53 ml                                 LV  SV:        62 ml   RIGHT VENTRICLE             IVC  RV Basal diam:  3.95 cm     IVC diam: 1.90 cm  RV Mid diam:    3.50 cm  RV S prime:     11.60 cm/s  TAPSE (M-mode): 1.5 cm  RVSP:           22.4 mmHg   LEFT ATRIUM             Index        RIGHT ATRIUM           Index  LA diam:  3.40 cm 1.69 cm/m   RA Pressure: 3.00 mmHg  LA Vol (A2C):   30.9 ml 15.34 ml/m  RA Area:     13.40 cm  LA Vol (A4C):   24.4 ml 12.11 ml/m  RA Volume:   33.60 ml  16.68 ml/m  LA Biplane Vol: 28.0 ml 13.90 ml/m   AORTIC VALVE  AV Area (Vmax):    3.63 cm  AV Area (Vmean):   3.67 cm  AV Area (VTI):     3.87 cm  AV Vmax:           174.33 cm/s  AV Vmean:          121.333 cm/s  AV VTI:            0.334 m  AV Peak Grad:      12.2 mmHg  AV Mean Grad:      7.0 mmHg  LVOT Vmax:         129.00 cm/s  LVOT Vmean:        90.600 cm/s  LVOT VTI:          0.263 m  LVOT/AV VTI ratio: 0.79     AORTA  Ao Root diam: 3.30 cm  Ao Asc diam:  4.00 cm   MITRAL VALVE               TRICUSPID VALVE                             TR Peak grad:   19.4 mmHg  MV Decel Time: 215 msec    TR Vmax:        220.00 cm/s  MV E velocity: 77.20 cm/s  Estimated RAP:  3.00 mmHg  MV A velocity: 84.60 cm/s  RVSP:           22.4 mmHg  MV E/A ratio:  0.91                             SHUNTS                             Systemic VTI:  0.26 m                             Systemic Diam: 2.50 cm   Cherlynn Kaiser MD  Electronically signed by Cherlynn Kaiser MD  Signature Date/Time: 12/09/2020/9:06:54 PM         Final   Impression:  He is doing very well almost 4 months following aortic valve replacement.  He is back to normal activity.  His echocardiogram last month showed a normal functioning aortic valve prosthesis with a low mean gradient of 7 mmHg.  Left ventricular ejection fraction was 54% which is unchanged from preoperatively.  Plan:  He will continue to follow-up with cardiology and will return to see me if he has any  problems with his incision.  I spent 15 minutes performing this established patient evaluation and > 50% of this time was spent face to face counseling and coordinating the care of this patient's recent aortic valve replacement.   Gaye Pollack, MD [facility] [phone]

## 2021-01-06 ENCOUNTER — Encounter: Payer: 59 | Admitting: Internal Medicine

## 2021-01-07 ENCOUNTER — Telehealth: Payer: Self-pay | Admitting: Internal Medicine

## 2021-01-07 ENCOUNTER — Other Ambulatory Visit: Payer: Self-pay | Admitting: Physician Assistant

## 2021-01-07 MED ORDER — AMOXICILLIN 500 MG PO TABS: 2000.0000 mg | ORAL_TABLET | Freq: Once | ORAL | 2 refills | Status: AC

## 2021-01-07 NOTE — Telephone Encounter (Signed)
Primary Cardiologist:Paula [name], MD  Chart reviewed as part of pre-operative protocol coverage. Because of Kirk Campbell past medical history and time since last visit, he/she will require a follow-up visit in order to better assess preoperative cardiovascular risk.  Pre-op covering staff: - Please inform patient that he will need antibiotic prophylaxis prior to any dental cleaning/procedure. I will send in prescription for Amoxicillin 2g oral to be taken 30-60 minutes prior to dental cleaning/procedures.  - Please contact requesting surgeon's office via preferred method (i.e, phone, fax) to inform them of need for SBE prophylaxis prior to surgery.  If applicable, this message will also be routed to pharmacy pool and/or primary cardiologist for input on holding anticoagulant/antiplatelet agent as requested below so that this information is available at time of patient's appointment.   Lenna Sciara, NP  01/07/2021, 8:45 AM

## 2021-01-07 NOTE — Telephone Encounter (Signed)
I s/w the dental office and informed them the pt will always need SBE prior to any dental work, including cleanings. Dental office has rescheduled the pt. I did inform them that we did call in Amoxicillin for the pt. DDS office thanked me for the call today and the help.   I then called and s/w the pt also. I stated to the pt that I have s/w the DDS office and that he is going to need an antibiotic. Pt states no one ever told him this. I explained to the pt since he has had a valve replacement he will need to always take an antibiotic prior to dental work, including dental cleanings. Pt informed Amoxicillin 500 mg tablet has been called in, instructions are to take 4 tablets 30 minutes to 1 hour prior to dental work/cleaning. Pt thanked me for the call and the help today.

## 2021-01-07 NOTE — Telephone Encounter (Signed)
° °  Pre-operative Risk Assessment    Patient Name: Kirk Campbell  DOB: [birth year] MRN: [id number]     Request for Surgical Clearance    Procedure:   Dental Cleaning  Date of Surgery:  Clearance 01/07/21                                 Surgeon:  Hygienist  Surgeon's Group or Practice Name:   [facility]  Phone number:  [phone] Fax number:  [phone]   Type of Clearance Requested:   - Pharmacy:  Hold  Wants to know if premeds are needed      Type of Anesthesia:  None    Additional requests/questions:  Does this patient need antibiotics?  Crist Infante   01/07/2021, 8:36 AM

## 2021-02-10 ENCOUNTER — Telehealth: Payer: Self-pay

## 2021-02-10 NOTE — Telephone Encounter (Signed)
Left message for pt to call back to schedule an Colonoscopy at the [facility]

## 2021-02-10 NOTE — Telephone Encounter (Signed)
Patient returned your call.

## 2021-02-11 ENCOUNTER — Other Ambulatory Visit: Payer: Self-pay

## 2021-02-11 DIAGNOSIS — Z1211 Encounter for screening for malignant neoplasm of colon: Secondary | ICD-10-CM

## 2021-02-11 NOTE — Telephone Encounter (Signed)
Pt scheduled for Colonoscopy at [facility] on 04/20/2021 at 10:15: Pt made aware  Case number [id number] Previsit appointment  scheduled for 03/18/2021 at 1:00 pm TELEPHONE VISIT. Pt made aware Pt verbalized understanding with all questions answered.

## 2021-03-18 ENCOUNTER — Other Ambulatory Visit: Payer: Self-pay

## 2021-03-18 ENCOUNTER — Ambulatory Visit (AMBULATORY_SURGERY_CENTER): Payer: 59

## 2021-03-18 VITALS — Ht 70.0 in | Wt 174.0 lb

## 2021-03-18 DIAGNOSIS — Z1211 Encounter for screening for malignant neoplasm of colon: Secondary | ICD-10-CM

## 2021-03-18 NOTE — Progress Notes (Signed)
No egg or soy allergy known to patient  ?No issues known to pt with past sedation with any surgeries or procedures ?Patient denies ever being told they had issues or difficulty with intubation  ?No FH of Malignant Hyperthermia ?Pt is not on diet pills ?Pt is not on  home 02  ?Pt is not on blood thinners  ?Pt denies issues with constipation  ?No A fib or A flutter ?Pt is fully vaccinated for Covid x 2; ?NO PA's for preps discussed with pt in PV today  ?Discussed with pt there will be an out-of-pocket cost for prep and that varies from $0 to 70 + dollars - pt verbalized understanding  ?Due to the COVID-19 pandemic we are asking patients to follow certain guidelines in PV and the [facility]   ?Pt aware of COVID protocols and [facility] guidelines  ?PV completed over the phone. Pt verified name, DOB, address and insurance during PV today.  ?Pt mailed instruction packet with copy of consent form to read and not return, and instructions.  ?Pt encouraged to call with questions or issues.  ?If pt has My chart, procedure instructions sent via My Chart  ? ?

## 2021-04-10 ENCOUNTER — Encounter (HOSPITAL_COMMUNITY): Payer: Self-pay | Admitting: Internal Medicine

## 2021-04-17 ENCOUNTER — Encounter: Payer: Self-pay | Admitting: Internal Medicine

## 2021-04-20 ENCOUNTER — Ambulatory Visit (HOSPITAL_BASED_OUTPATIENT_CLINIC_OR_DEPARTMENT_OTHER): Payer: 59 | Admitting: Anesthesiology

## 2021-04-20 ENCOUNTER — Ambulatory Visit (HOSPITAL_COMMUNITY): Payer: 59 | Admitting: Anesthesiology

## 2021-04-20 ENCOUNTER — Ambulatory Visit (HOSPITAL_COMMUNITY)
Admission: RE | Admit: 2021-04-20 | Discharge: 2021-04-20 | Disposition: A | Discharge status: Home / Self Care | Payer: 59 | Source: Ambulatory Visit | Attending: Internal Medicine | Admitting: Internal Medicine

## 2021-04-20 ENCOUNTER — Encounter (HOSPITAL_COMMUNITY): Payer: Self-pay

## 2021-04-20 ENCOUNTER — Encounter (HOSPITAL_COMMUNITY): Payer: Self-pay | Admitting: Internal Medicine

## 2021-04-20 ENCOUNTER — Other Ambulatory Visit: Payer: Self-pay

## 2021-04-20 ENCOUNTER — Encounter (HOSPITAL_COMMUNITY)
Admission: RE | Disposition: A | Discharge status: Home / Self Care | Payer: Self-pay | Source: Ambulatory Visit | Attending: Internal Medicine

## 2021-04-20 DIAGNOSIS — K5730 Diverticulosis of large intestine without perforation or abscess without bleeding: Secondary | ICD-10-CM | POA: Diagnosis not present

## 2021-04-20 DIAGNOSIS — D124 Benign neoplasm of descending colon: Secondary | ICD-10-CM | POA: Diagnosis not present

## 2021-04-20 DIAGNOSIS — K219 Gastro-esophageal reflux disease without esophagitis: Secondary | ICD-10-CM | POA: Insufficient documentation

## 2021-04-20 DIAGNOSIS — K635 Polyp of colon: Secondary | ICD-10-CM | POA: Diagnosis not present

## 2021-04-20 DIAGNOSIS — D120 Benign neoplasm of cecum: Secondary | ICD-10-CM | POA: Insufficient documentation

## 2021-04-20 DIAGNOSIS — I350 Nonrheumatic aortic (valve) stenosis: Secondary | ICD-10-CM | POA: Diagnosis not present

## 2021-04-20 DIAGNOSIS — Z1211 Encounter for screening for malignant neoplasm of colon: Secondary | ICD-10-CM | POA: Diagnosis present

## 2021-04-20 HISTORY — PX: POLYPECTOMY: SHX5525

## 2021-04-20 HISTORY — PX: COLONOSCOPY WITH PROPOFOL: SHX5780

## 2021-04-20 SURGERY — COLONOSCOPY WITH PROPOFOL
Anesthesia: Monitor Anesthesia Care

## 2021-04-20 MED ORDER — LACTATED RINGERS IV SOLN
INTRAVENOUS | Status: DC
Start: 2021-04-20 — End: 2021-04-20

## 2021-04-20 MED ORDER — SODIUM CHLORIDE 0.9 % IV SOLN: INTRAVENOUS | Status: DC

## 2021-04-20 MED ORDER — PROPOFOL 10 MG/ML IV BOLUS
INTRAVENOUS | Status: DC | PRN
  Administered 2021-04-20: 20 mg via INTRAVENOUS
  Administered 2021-04-20: 30 mg via INTRAVENOUS

## 2021-04-20 MED ORDER — LIDOCAINE 2% (20 MG/ML) 5 ML SYRINGE
INTRAMUSCULAR | Status: DC | PRN
Start: 2021-04-20 — End: 2021-04-20
  Administered 2021-04-20: 100 mg via INTRAVENOUS

## 2021-04-20 MED ORDER — PROPOFOL 500 MG/50ML IV EMUL
INTRAVENOUS | Status: DC | PRN
  Administered 2021-04-20: 150 ug/kg/min via INTRAVENOUS

## 2021-04-20 SURGICAL SUPPLY — 22 items

## 2021-04-20 NOTE — Discharge Instructions (Addendum)
I found and removed 4 tiny polyps that look benign. ? ?I will let you know pathology results and when to have another routine colonoscopy by mail and/or My Chart. ? ?You also have a condition called diverticulosis - common and not usually a problem. Please read the handout provided. ? ?I appreciate the opportunity to care for you. ?Gatha Mayer, MD, [facility] ? ?YOU HAD AN ENDOSCOPIC PROCEDURE TODAY: Refer to the procedure report and other information in the discharge instructions given to you for any specific questions about what was found during the examination. If this information does not answer your questions, please call Dr. Celesta Aver office at [phone] to clarify.  ? ?YOU SHOULD EXPECT: Some feelings of bloating in the abdomen. Passage of more gas than usual. Walking can help get rid of the air that was put into your GI tract during the procedure and reduce the bloating. If you had a lower endoscopy (such as a colonoscopy or flexible sigmoidoscopy) you may notice spotting of blood in your stool or on the toilet paper. Some abdominal soreness may be present for a day or two, also. ? ?DIET: Your first meal following the procedure should be a light meal and then it is ok to progress to your normal diet. A half-sandwich or bowl of soup is an example of a good first meal. Heavy or fried foods are harder to digest and may make you feel nauseous or bloated. Drink plenty of fluids but you should avoid alcoholic beverages for 24 hours.  ? ?ACTIVITY: Your care partner should take you home directly after the procedure. You should plan to take it easy, moving slowly for the rest of the day. You can resume normal activity the day after the procedure however YOU SHOULD NOT DRIVE, use power tools, machinery or perform tasks that involve climbing or major physical exertion for 24 hours (because of the sedation medicines used during the test).  ? ?SYMPTOMS TO REPORT IMMEDIATELY: ?A gastroenterologist can be reached at any  hour. Please call [phone]  for any of the following symptoms:  ?Following lower endoscopy (colonoscopy, flexible sigmoidoscopy) ?Excessive amounts of blood in the stool  ?Significant tenderness, worsening of abdominal pains  ?Swelling of the abdomen that is new, acute  ?Fever of 100? or higher  ? ? ? ?

## 2021-04-20 NOTE — Anesthesia Postprocedure Evaluation (Signed)
Anesthesia Post Note ? ?Patient: Kirk Campbell ? ?Procedure(s) Performed: COLONOSCOPY WITH PROPOFOL ?POLYPECTOMY ? ?  ? ?Patient location during evaluation: Endoscopy ?Anesthesia Type: MAC ?Level of consciousness: awake and alert ?Pain management: pain level controlled ?Vital Signs Assessment: post-procedure vital signs reviewed and stable ?Respiratory status: spontaneous breathing, nonlabored ventilation, respiratory function stable and patient connected to nasal cannula oxygen ?Cardiovascular status: stable and blood pressure returned to baseline ?Postop Assessment: no apparent nausea or vomiting ?Anesthetic complications: no ? ? ?No notable events documented. ? ?Last Vitals:  ?Vitals:  ? 04/20/21 1121 04/20/21 1130  ?BP: 116/78 134/71  ?Pulse: 66 69  ?Resp: 19 14  ?Temp:    ?SpO2: 100% 98%  ?  ?Last Pain:  ?Vitals:  ? 04/20/21 1130  ?TempSrc:   ?PainSc: 0-No pain  ? ? ?  ?  ?  ?  ?  ?  ? ?Kirk Campbell ? ? ? ? ?

## 2021-04-20 NOTE — H&P (Signed)
[facility] History and Physical ? ? ?Primary Care Physician:  [name], MD ? ? ?Reason for Procedure:   CRCa screening ? ?Plan:    colonoscopy ? ? ? ? ?HPI: Kirk Campbell is a 61 y.o. male w/ hx AVR and difficult airway here for screening colonoscopy exam. ? ? ?Past Medical History:  ?Diagnosis Date  ? GERD (gastroesophageal reflux disease)   ? on meds  ? Heart murmur 08/2020  ? pre aortic valve repleacement in 08/2020  ? PONV (postoperative nausea and vomiting)   ? Reflux   ? Right inguinal hernia   ? ? ?Past Surgical History:  ?Procedure Laterality Date  ? ANTERIOR CERVICAL DECOMP/DISCECTOMY FUSION N/A 12/27/2014  ? Procedure: C5-6, C6-7 Anterior Cervical Discectomy and Fusion, Allograft, Plate;  Surgeon: Marybelle Killings, MD;  Location: [facility];  Service: Orthopedics;  Laterality: N/A;  ? AORTIC VALVE REPLACEMENT N/A 09/01/2020  ? Procedure: AORTIC VALVE REPLACEMENT (AVR) USING INSPIRIS VALVE SIZE 25MM;  Surgeon: Gaye Pollack, MD;  Location: [facility];  Service: Open Heart Surgery;  Laterality: N/A;  ? COLONOSCOPY  2011  ? CG-F/V-moviprep (exc)-mod tics  ? EXPLORATION POST OPERATIVE OPEN HEART N/A 09/01/2020  ? Procedure: EXPLORATION POST OPERATIVE OPEN HEART;  Surgeon: Gaye Pollack, MD;  Location: [facility];  Service: Open Heart Surgery;  Laterality: N/A;  ? INGUINAL HERNIA REPAIR Right   ? RIGHT/LEFT HEART CATH AND CORONARY ANGIOGRAPHY N/A 06/27/2020  ? Procedure: RIGHT/LEFT HEART CATH AND CORONARY ANGIOGRAPHY;  Surgeon: Burnell Blanks, MD;  Location: [facility];  Service: Cardiovascular;  Laterality: N/A;  ? TEE WITHOUT CARDIOVERSION N/A 09/01/2020  ? Procedure: TRANSESOPHAGEAL ECHOCARDIOGRAM (TEE);  Surgeon: Gaye Pollack, MD;  Location: [facility];  Service: Open Heart Surgery;  Laterality: N/A;  ? ? ?Prior to Admission medications   ?Medication Sig Start Date End Date Taking? Authorizing Provider  ?acetaminophen (TYLENOL) 500 MG tablet Take 1,000 mg by mouth every 6 (six)  hours as needed (for pain.).   Yes [provider]  ?aspirin EC 81 MG tablet Take 81 mg by mouth in the morning.   Yes [provider]  ?Melatonin 10 MG TABS Take 10 mg by mouth at bedtime.   Yes [provider]  ?Multiple Vitamin (MULTIVITAMIN WITH MINERALS) TABS tablet Take 1 tablet by mouth daily. Men's Multivitamin   Yes [provider]  ?amoxicillin (AMOXIL) 500 MG tablet Take 2,000 mg by mouth once. Prior to dental procedure 01/07/21   [provider]  ? ? ?Current Facility-Administered Medications  ?Medication Dose Route Frequency Provider Last Rate Last Admin  ? 0.9 %  sodium chloride infusion   Intravenous Continuous Gatha Mayer, MD      ? lactated ringers infusion   Intravenous Continuous Gatha Mayer, MD 10 mL/hr at 04/20/21 0935 New Bag at 04/20/21 0935  ? ? ?Allergies as of 02/11/2021  ? (No Known Allergies)  ? ? ?Family History  ?Problem Relation Age of Onset  ? COPD Mother   ? Diabetes Mother   ? Cancer Sister   ? Heart attack Neg Hx   ? Hyperlipidemia Neg Hx   ? Hypertension Neg Hx   ? Colon cancer Neg Hx   ? Colon polyps Neg Hx   ? Esophageal cancer Neg Hx   ? Rectal cancer Neg Hx   ? Stomach cancer Neg Hx   ? ? ?Social History  ? ?Socioeconomic History  ? Marital status: Married  ?  Spouse name: Not on file  ? Number of children: Not on file  ? Years of education: Not on file  ? Highest education level: Not on file  ?Occupational History  ? Not on file  ?Tobacco Use  ? Smoking status: Never  ? Smokeless tobacco: Never  ?Vaping Use  ? Vaping Use: Never used  ?Substance and Sexual Activity  ? Alcohol use: Yes  ?  Alcohol/week: 3.0 - 6.0 standard drinks  ?  Types: 3 - 6 Standard drinks or equivalent per week  ? Drug use: Never  ? Sexual activity: Not on file  ?Other Topics Concern  ? Not on file  ?Social History Narrative  ? Married  ? 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 Second home [facility]  ? Occasional alcohol never drugs  never tobacco  ? ?Social Determinants of Health  ? ?Financial Resource Strain: Not on file  ?Food Insecurity: Not on file  ?Transportation Needs: Not on file  ?Physical Activity: Not on file  ?Stress: Not on file  ?Social Connections: Not on file  ?Intimate Partner Violence: Not on file  ? ? ?Review of Systems: ? ?All other review of systems negative except as mentioned in the HPI. ? ?Physical Exam: ?Vital signs ?BP 134/76   Pulse 73   Temp 97.7 ?F (36.5 ?C) (Temporal)   Resp 13   Ht 5' 10.5" (1.791 m)   Wt 77.6 kg   SpO2 99%   BMI 24.19 kg/m?  ? ?General:   Alert,  Well-developed, well-nourished, pleasant and cooperative in NAD ?Lungs:  Clear throughout to auscultation.   ?Heart:  Regular rate and rhythm; no murmurs, clicks, rubs,  or gallops. ?Abdomen:  Soft, nontender and nondistended. Normal bowel sounds.   ?Neuro/Psych:  Alert and cooperative. Normal mood and affect. A and O x 3 ? ? ?'@Derriana Oser'$  Simonne Maffucci, MD, [facility] ?[facility] ?[phone] (pager) ?04/20/2021 10:12 AM@ ? ? ?

## 2021-04-20 NOTE — Anesthesia Preprocedure Evaluation (Signed)
Anesthesia Evaluation  ?Patient identified by MRN, date of birth, ID band ?Patient awake ? ? ? ?Reviewed: ?NPO status , Patient's Chart, lab work & pertinent test results ? ?History of Anesthesia Complications ?(+) PONV and history of anesthetic complications ? ?Airway ?Mallampati: II ? ?TM Distance: >3 FB ?Neck ROM: Full ? ? ? Dental ?no notable dental hx. ? ?  ?Pulmonary ?neg pulmonary ROS,  ?  ?Pulmonary exam normal ? ? ? ? ? ? ? Cardiovascular ?+ Valvular Problems/Murmurs (s/p AVR 2022) AS  ?Rhythm:Regular Rate:Normal ?+ Systolic murmurs ? ?  ?Neuro/Psych ?negative neurological ROS ? negative psych ROS  ? GI/Hepatic ?Neg liver ROS, GERD  Controlled,CRC screening ?  ?Endo/Other  ?negative endocrine ROS ? Renal/GU ?negative Renal ROS  ?negative genitourinary ?  ?Musculoskeletal ?negative musculoskeletal ROS ?(+)  ? Abdominal ?Normal abdominal exam  (+)   ?Peds ? Hematology ?negative hematology ROS ?(+)   ?Anesthesia Other Findings ? ? Reproductive/Obstetrics ? ?  ? ? ? ? ? ? ? ? ? ? ? ? ? ?  ?  ? ? ? ? ? ? ? ? ?Anesthesia Physical ?Anesthesia Plan ? ?ASA: 2 ? ?Anesthesia Plan: MAC  ? ?Post-op Pain Management:   ? ?Induction: Intravenous ? ?PONV Risk Score and Plan: 2 and Propofol infusion and Treatment may vary due to age or medical condition ? ?Airway Management Planned: Simple Face Mask, Natural Airway and Nasal Cannula ? ?Additional Equipment: None ? ?Intra-op Plan:  ? ?Post-operative Plan:  ? ?Informed Consent: I have reviewed the patients History and Physical, chart, labs and discussed the procedure including the risks, benefits and alternatives for the proposed anesthesia with the patient or authorized representative who has indicated his/her understanding and acceptance.  ? ? ? ?Dental advisory given ? ?Plan Discussed with: CRNA ? ?Anesthesia Plan Comments: (Lab Results ?     Component                Value               Date                 ?     WBC                       11.2 (H)            09/09/2020           ?     HGB                      9.4 (L)             09/09/2020           ?     HCT                      29.7 (L)            09/09/2020           ?     MCV                      94                  09/09/2020           ?     PLT  506 (H)             09/09/2020           ?Lab Results ?     Component                Value               Date                 ?     NA                       140                 09/09/2020           ?     K                        4.9                 09/09/2020           ?     CO2                      22                  09/09/2020           ?     GLUCOSE                  99                  09/09/2020           ?     BUN                      13                  09/09/2020           ?     CREATININE               0.87                09/09/2020           ?     CALCIUM                  9.6                 09/09/2020           ?     EGFR                     99                  09/09/2020           ?     GFRNONAA                 >60                 09/06/2020           ?ECHO 11/22: ??1. Left ventricular ejection fraction by 3D volume is 54 %. The left  ?ventricle has low normal function. The left ventricle has no regional wall  ?motion abnormalities. Left ventricular diastolic parameters are consistent  ?with Grade  I diastolic dysfunction  ??(impaired relaxation). The average left ventricular global longitudinal  ?strain is -19.8 %. The global longitudinal strain is normal.  ??2. Right ventricular systolic function is mildly reduced. The right  ?ventricular size is mildly enlarged. There is normal pulmonary artery  ?systolic pressure. The estimated right ventricular systolic pressure is  ?01.6 mmHg.  ??3. The mitral valve is grossly normal. Trivial mitral valve  ?regurgitation. No evidence of mitral stenosis.  ??4. The aortic valve has been repaired/replaced. Aortic valve  ?regurgitation is not visualized. There is a 25 mm Edwards Inspiris  Resilia  ?valve present in the aortic position. Procedure Date: 09/01/20. Echo  ?findings are consistent with normal structure and  ??function of the aortic valve prosthesis. Aortic valve mean gradient  ?measures 7.0 mmHg. Aortic valve Vmax measures 1.74 m/s. Aortic valve  ?acceleration time measures 81 msec, DVI 0.79.  ??5. Aortic dilatation noted. There is mild dilatation of the ascending  ?aorta, measuring 40 mm.  ??6. The inferior vena cava is normal in size with greater than 50%  ?respiratory variability, suggesting right atrial pressure of 3 mmHg. )  ? ? ? ? ? ? ?Anesthesia Quick Evaluation ? ?

## 2021-04-20 NOTE — Transfer of Care (Signed)
Immediate Anesthesia Transfer of Care Note ? ?Patient: Kirk Campbell ? ?Procedure(s) Performed: COLONOSCOPY WITH PROPOFOL ?POLYPECTOMY ? ?Patient Location: PACU ? ?Anesthesia Type:MAC ? ?Level of Consciousness: drowsy ? ?Airway & Oxygen Therapy: Patient Spontanous Breathing and Patient connected to face mask oxygen ? ?Post-op Assessment: Report given to RN and Post -op Vital signs reviewed and stable ? ?Post vital signs: Reviewed and stable ? ?Last Vitals:  ?Vitals Value Taken Time  ?BP 111/78 04/20/21 1100  ?Temp    ?Pulse 69 04/20/21 1102  ?Resp 15 04/20/21 1102  ?SpO2 100 % 04/20/21 1102  ?Vitals shown include unvalidated device data. ? ?Last Pain:  ?Vitals:  ? 04/20/21 0929  ?TempSrc: Temporal  ?PainSc: 0-No pain  ?   ? ?  ? ?Complications: No notable events documented. ?

## 2021-04-20 NOTE — Op Note (Signed)
[facility] ?Patient Name: Kirk Campbell ?Procedure Date: 04/20/2021 ?MRN: [id number] ?Attending MD: Gatha Mayer , MD ?Date of Birth: 08-08-1960 ?CSN: [id number] ?Age: 61 ?Admit Type: Outpatient ?Procedure:                Colonoscopy ?Indications:              Screening for colorectal malignant neoplasm ?Providers:                Gatha Mayer, MD, Kary Kos RN, RN, Charlean Merl  ?                          Purcell Nails, Merchant [employer] officer, [employer], CRNA ?Referring MD:              ?Medicines:                Monitored Anesthesia Care ?Complications:            No immediate complications. ?Estimated Blood Loss:     Estimated blood loss was minimal. ?Procedure:                Pre-Anesthesia Assessment: ?                          - Prior to the procedure, a History and Physical  ?                          was performed, and patient medications and  ?                          allergies were reviewed. The patient's tolerance of  ?                          previous anesthesia was also reviewed. The risks  ?                          and benefits of the procedure and the sedation  ?                          options and risks were discussed with the patient.  ?                          All questions were answered, and informed consent  ?                          was obtained. Prior Anticoagulants: The patient has  ?                          taken no previous anticoagulant or antiplatelet  ?                          agents. ASA Grade Assessment: III - A patient with  ?                          severe systemic disease. After reviewing the risks  ?  and benefits, the patient was deemed in  ?                          satisfactory condition to undergo the procedure. ?                          After obtaining informed consent, the colonoscope  ?                          was passed under direct vision. Throughout the  ?                          procedure, the patient's blood pressure, pulse, and   ?                          oxygen saturations were monitored continuously. The  ?                          CF-HQ190L ([id number]) Olympus colonoscope was  ?                          introduced through the anus and advanced to the the  ?                          cecum, identified by appendiceal orifice and  ?                          ileocecal valve. The colonoscopy was performed  ?                          without difficulty. The patient tolerated the  ?                          procedure well. The quality of the bowel  ?                          preparation was excellent. The bowel preparation  ?                          used was Miralax via split dose instruction. The  ?                          ileocecal valve, appendiceal orifice, and rectum  ?                          were photographed. ?Scope In: 10:34:00 AM ?Scope Out: 10:55:40 AM ?Scope Withdrawal Time: 0 hours 19 minutes 28 seconds  ?Total Procedure Duration: 0 hours 21 minutes 40 seconds  ?Findings: ?     The perianal and digital rectal examinations were normal. ?     Four sessile polyps were found in the descending colon and cecum. The  ?     polyps were diminutive in size. These polyps were removed with a cold  ?     snare. Resection and retrieval were complete. Verification of patient  ?  identification for the specimen was done. Estimated blood loss was  ?     minimal. ?     Multiple diverticula were found in the sigmoid colon. ?     The exam was otherwise without abnormality on direct and retroflexion  ?     views. ?Impression:               - Four diminutive polyps in the descending colon  ?                          and in the cecum, removed with a cold snare.  ?                          Resected and retrieved. ?                          - Diverticulosis in the sigmoid colon. ?                          - The examination was otherwise normal on direct  ?                          and retroflexion views. ?Moderate Sedation: ?     Not Applicable -  Patient had care per Anesthesia. ?Recommendation:           - Patient has a contact number available for  ?                          emergencies. The signs and symptoms of potential  ?                          delayed complications were discussed with the  ?                          patient. Return to normal activities tomorrow.  ?                          Written discharge instructions were provided to the  ?                          patient. ?                          - Resume previous diet. ?                          - Continue present medications. ?                          - Await pathology results. ?                          - Repeat colonoscopy is recommended. The  ?                          colonoscopy date will be determined after pathology  ?  results from today's exam become available for  ?                          review. HE HAS HX DIFFICULT AIRWAY AT INTUBATION SO  ?                          PROCEDURES WILL BE AT HOSPITAL ?                          - Will cc: Dr. Linna Darner at [facility]  ?                          Services ?Procedure Code(s):        --- Professional --- ?                          [phone], Colonoscopy, flexible; with removal of  ?                          tumor(s), polyp(s), or other lesion(s) by snare  ?                          technique ?Diagnosis Code(s):        --- Professional --- ?                          Z12.11, Encounter for screening for malignant  ?                          neoplasm of colon ?                          K63.5, Polyp of colon ?                          K57.30, Diverticulosis of large intestine without  ?                          perforation or abscess without bleeding ?CPT copyright 2019 American Medical Association. All rights reserved. ?The codes documented in this report are preliminary and upon coder review may  ?be revised to meet current compliance requirements. ?Gatha Mayer, MD ?04/20/2021 11:05:41 AM ?This report has been  signed electronically. ?Number of Addenda: 0 ?

## 2021-04-21 ENCOUNTER — Encounter (HOSPITAL_COMMUNITY): Payer: Self-pay | Admitting: Internal Medicine

## 2021-04-21 DIAGNOSIS — Z860101 Personal history of adenomatous and serrated colon polyps: Secondary | ICD-10-CM

## 2021-04-21 DIAGNOSIS — Z86010 Personal history of colonic polyps: Secondary | ICD-10-CM

## 2021-04-21 HISTORY — DX: Personal history of adenomatous and serrated colon polyps: Z86.0101

## 2021-04-21 HISTORY — DX: Personal history of colonic polyps: Z86.010

## 2021-04-21 LAB — SURGICAL PATHOLOGY

## 2021-04-27 ENCOUNTER — Encounter: Payer: Self-pay | Admitting: Internal Medicine

## 2021-04-27 DIAGNOSIS — T884XXA Failed or difficult intubation, initial encounter: Secondary | ICD-10-CM

## 2021-04-27 HISTORY — DX: Failed or difficult intubation, initial encounter: T88.4XXA

## 2021-07-06 NOTE — Progress Notes (Unsigned)
Cardiology Office Note   Date:  07/07/2021   ID:  Kirk, Campbell [birth year], MRN [id number]  PCP:  [name], MD  Cardiologist:   Dorris Carnes, MD   Patient presents for follow up of AV dz     History of Present Illness: Kirk Campbell is a 61 y.o. male with a history of AV disease  He is s/p AVR on 09/01/20 for AS/AI   Kirk Campbell)   I last saw the patient in clinic back in October 2022.  In the interval he has done well.  Breathing is okay.  No chest pain.  Rare dizziness with going from bending to standing.  No palpitations.  He is increasing his activity level.  Going to the gym has some soreness when he pushes weights and notes he is a little discomfort with pushing weights.  Current Meds  Medication Sig   acetaminophen (TYLENOL) 500 MG tablet Take 1,000 mg by mouth every 6 (six) hours as needed (for pain.).   amoxicillin (AMOXIL) 500 MG tablet Take 2,000 mg by mouth once. Prior to dental procedure   aspirin EC 81 MG tablet Take 81 mg by mouth in the morning.   Melatonin 10 MG TABS Take 10 mg by mouth at bedtime.   Multiple Vitamin (MULTIVITAMIN WITH MINERALS) TABS tablet Take 1 tablet by mouth daily. Men's Multivitamin     Allergies:   Patient has no known allergies.   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/27/2021   GERD (gastroesophageal reflux disease)    on meds   Heart murmur 08/2020   pre aortic valve repleacement in 08/2020   Hx of adenomatous colonic polyps 04/21/2021   4 diminutive adenomas recall 2028   PONV (postoperative nausea and vomiting)    Reflux    Right inguinal hernia     Past Surgical History:  Procedure Laterality Date   ANTERIOR CERVICAL DECOMP/DISCECTOMY FUSION N/A 12/27/2014   Procedure: C5-6, C6-7 Anterior Cervical Discectomy and Fusion, Allograft, Plate;  Surgeon: Marybelle Killings, MD;  Location: [facility];  Service: Orthopedics;  Laterality: N/A;   AORTIC VALVE REPLACEMENT N/A 09/01/2020    Procedure: AORTIC VALVE REPLACEMENT (AVR) USING INSPIRIS VALVE SIZE 25MM;  Surgeon: Gaye Pollack, MD;  Location: [facility];  Service: Open Heart Surgery;  Laterality: N/A;   COLONOSCOPY  2011   CG-F/V-moviprep (exc)-mod tics   COLONOSCOPY WITH PROPOFOL N/A 04/20/2021   Procedure: COLONOSCOPY WITH PROPOFOL;  Surgeon: Gatha Mayer, MD;  Location: [facility];  Service: Endoscopy;  Laterality: N/A;   EXPLORATION POST OPERATIVE OPEN HEART N/A 09/01/2020   Procedure: EXPLORATION POST OPERATIVE OPEN HEART;  Surgeon: Gaye Pollack, MD;  Location: [facility];  Service: Open Heart Surgery;  Laterality: N/A;   INGUINAL HERNIA REPAIR Right    POLYPECTOMY  04/20/2021   Procedure: POLYPECTOMY;  Surgeon: Gatha Mayer, MD;  Location: [facility];  Service: Endoscopy;;   RIGHT/LEFT HEART CATH AND CORONARY ANGIOGRAPHY N/A 06/27/2020   Procedure: RIGHT/LEFT HEART CATH AND CORONARY ANGIOGRAPHY;  Surgeon: Burnell Blanks, MD;  Location: [facility];  Service: Cardiovascular;  Laterality: N/A;   TEE WITHOUT CARDIOVERSION N/A 09/01/2020   Procedure: TRANSESOPHAGEAL ECHOCARDIOGRAM (TEE);  Surgeon: Gaye Pollack, MD;  Location: [facility];  Service: Open Heart Surgery;  Laterality: N/A;     Social History:  The patient  reports that he has never smoked. He has never used smokeless tobacco. He reports  current alcohol use of about 3.0 - 6.0 standard drinks of alcohol per week. He reports that he does not use drugs.   Family History:  The patient's family history includes COPD in his mother; Cancer in his sister; Diabetes in his mother.    ROS:  Please see the history of present illness. All other systems are reviewed and  Negative to the above problem except as noted.    PHYSICAL EXAM: VS:  BP 116/80   Pulse 74   Ht '5\' 10"'$  (1.778 m)   Wt 180 lb (81.6 kg)   SpO2 99%   BMI 25.83 kg/m   GEN: Well nourished, well developed, in no acute distress  HEENT: normal  Neck: no JVD, carotid bruits, or  masses Cardiac: RRR; Gr I/VI systolic murmur base  No LE edema  Respiratory:  clear to auscultation bilaterally, normal work of breathing GI: soft, nontender, nondistended, + BS  No hepatomegaly  MS: no deformity Moving all extremities   Skin: warm and dry, no rash Neuro:  Strength and sensation are intact Psych: euthymic mood, full affect   EKG:  EKG is not ordered today.  Cardiac studies  Echo  Nov 2022 Left ventricular ejection fraction by 3D volume is 54 %. The left ventricle has low normal function. The left ventricle has no regional wall motion abnormalities. Left ventricular diastolic parameters are consistent with Grade I diastolic dysfunction (impaired relaxation). The average left ventricular global longitudinal strain is -19.8 %. The global longitudinal strain is normal. 1. Right ventricular systolic function is mildly reduced. The right ventricular size is mildly enlarged. There is normal pulmonary artery systolic pressure. The estimated right ventricular systolic pressure is 25.0 mmHg. 2. The mitral valve is grossly normal. Trivial mitral valve regurgitation. No evidence of mitral stenosis. 3. The aortic valve has been repaired/replaced. Aortic valve regurgitation is not visualized. There is a 25 mm Edwards Inspiris Resilia valve present in the aortic position. Procedure Date: 09/01/20. Echo findings are consistent with normal structure and function of the aortic valve prosthesis. Aortic valve mean gradient measures 7.0 mmHg. Aortic valve Vmax measures 1.74 m/s. Aortic valve acceleration time measures 81 msec, DVI 0.79. 4. Aortic dilatation noted. There is mild dilatation of the ascending aorta, measuring 40 mm. 5. The inferior vena cava is normal in size with greater than 50% respiratory variability, suggesting right atrial pressure of 3 mmHg.  Carotid USN  Aug 2022  Right Carotid: The extracranial vessels were near-normal with only minimal wall thickening  or plaque. Left Carotid: The extracranial vessels were near-normal with only minimal wall thickening or plaque. Vertebrals: Bilateral vertebral arteries demonstrate antegrade flow. Subclavians: Normal flow hemodynamics were seen in bilateral subclavian arteries. Right Upper Extremity: No significant arterial obstruction detected in the right upper extremity. Doppler   Cardiac Cath    June 2022  1. No angiographic evidence of CAD 2. Normal filling pressures.  3. Moderate aortic stenosis (mean gradient 38.7 mmHg, peak to peak gradient 35 mmHg, AVA 1.0 cm2). Severe aortic insufficiency by echo.   Lipid Panel No results found for: "CHOL", "TRIG", "HDL", "CHOLHDL", "VLDL", "LDLCALC", "LDLDIRECT"    Wt Readings from Last 3 Encounters:  07/07/21 180 lb (81.6 kg)  04/20/21 171 lb (77.6 kg)  03/18/21 174 lb (78.9 kg)      ASSESSMENT AND PLAN:  1  AV disease  Pt with bicuspid AV   haad severe AS mod AI   Underwent AVR 09/01/20 (25 mm Edwards INSPIRIS RESILIA pericardial valve  Contineuds to do well  No significant murmurs aon exam      Keep on current regimen      2  Lipids  Last lipids in September LDL 96  HDL 49   Follow   Encouraged him to stay active   Stay hydrated     F/U in 1 year   Current medicines are reviewed at length with the patient today.  The patient does not have concerns regarding medicines.  Signed, Dorris Carnes, MD  07/07/2021 9:26 AM    [address], [place], [place]  [address] Phone: [phone]; Fax: [phone]

## 2021-07-07 ENCOUNTER — Ambulatory Visit: Payer: 59 | Admitting: Internal Medicine

## 2021-07-07 ENCOUNTER — Encounter: Payer: Self-pay | Admitting: Internal Medicine

## 2021-07-07 VITALS — BP 116/80 | HR 74 | Ht 70.0 in | Wt 180.0 lb

## 2021-07-07 DIAGNOSIS — I359 Nonrheumatic aortic valve disorder, unspecified: Secondary | ICD-10-CM | POA: Diagnosis not present

## 2021-07-07 NOTE — Patient Instructions (Signed)
Medication Instructions:  Your physician recommends that you continue on your current medications as directed. Please refer to the Current Medication list given to you today. *If you need a refill on your cardiac medications before your next appointment, please call your pharmacy*   Lab Work: None Ordered   Testing/Procedures: None Ordered   Follow-Up: At [facility], you and your health needs are our priority.  As part of our continuing mission to provide you with exceptional heart care, we have created designated Provider Care Teams.  These Care Teams include your primary Cardiologist (physician) and Advanced Practice Providers (APPs -  Physician Assistants and Nurse Practitioners) who all work together to provide you with the care you need, when you need it.  We recommend signing up for the patient portal called "MyChart".  Sign up information is provided on this After Visit Summary.  MyChart is used to connect with patients for Virtual Visits (Telemedicine).  Patients are able to view lab/test results, encounter notes, upcoming appointments, etc.  Non-urgent messages can be sent to your provider as well.   To learn more about what you can do with MyChart, go to [email or web address].    Your next appointment:   1 year(s)  The format for your next appointment:   In Person  Provider:   Dorris Carnes, MD     Other Instructions   Important Information About Sugar

## 2022-03-24 IMAGING — CR DG CHEST 2V
2 series · 2 of 2 positions shown · non-contrast
Comparison: 12/20/2014

CLINICAL DATA: Preop evaluation

EXAM:
CHEST - 2 VIEW

[w chest pa]
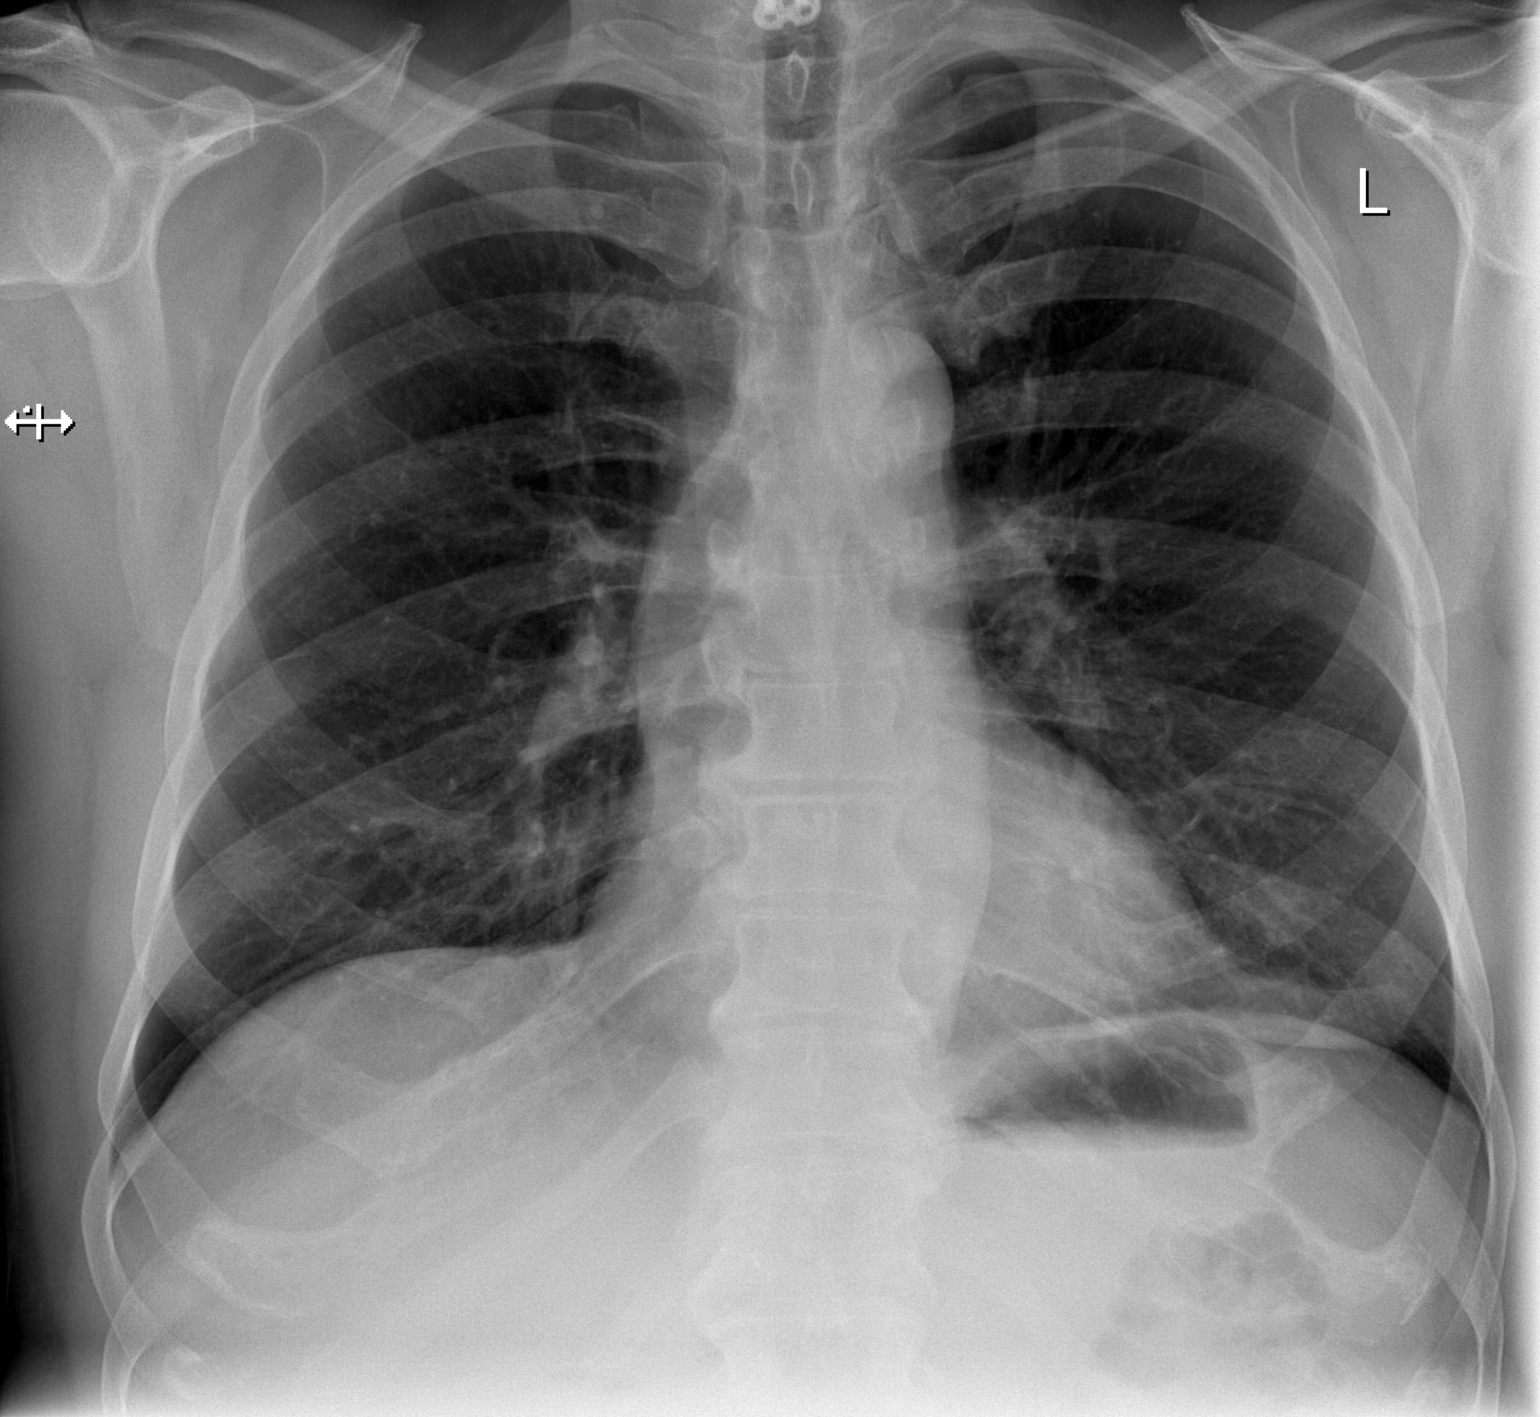

[w chest lat]
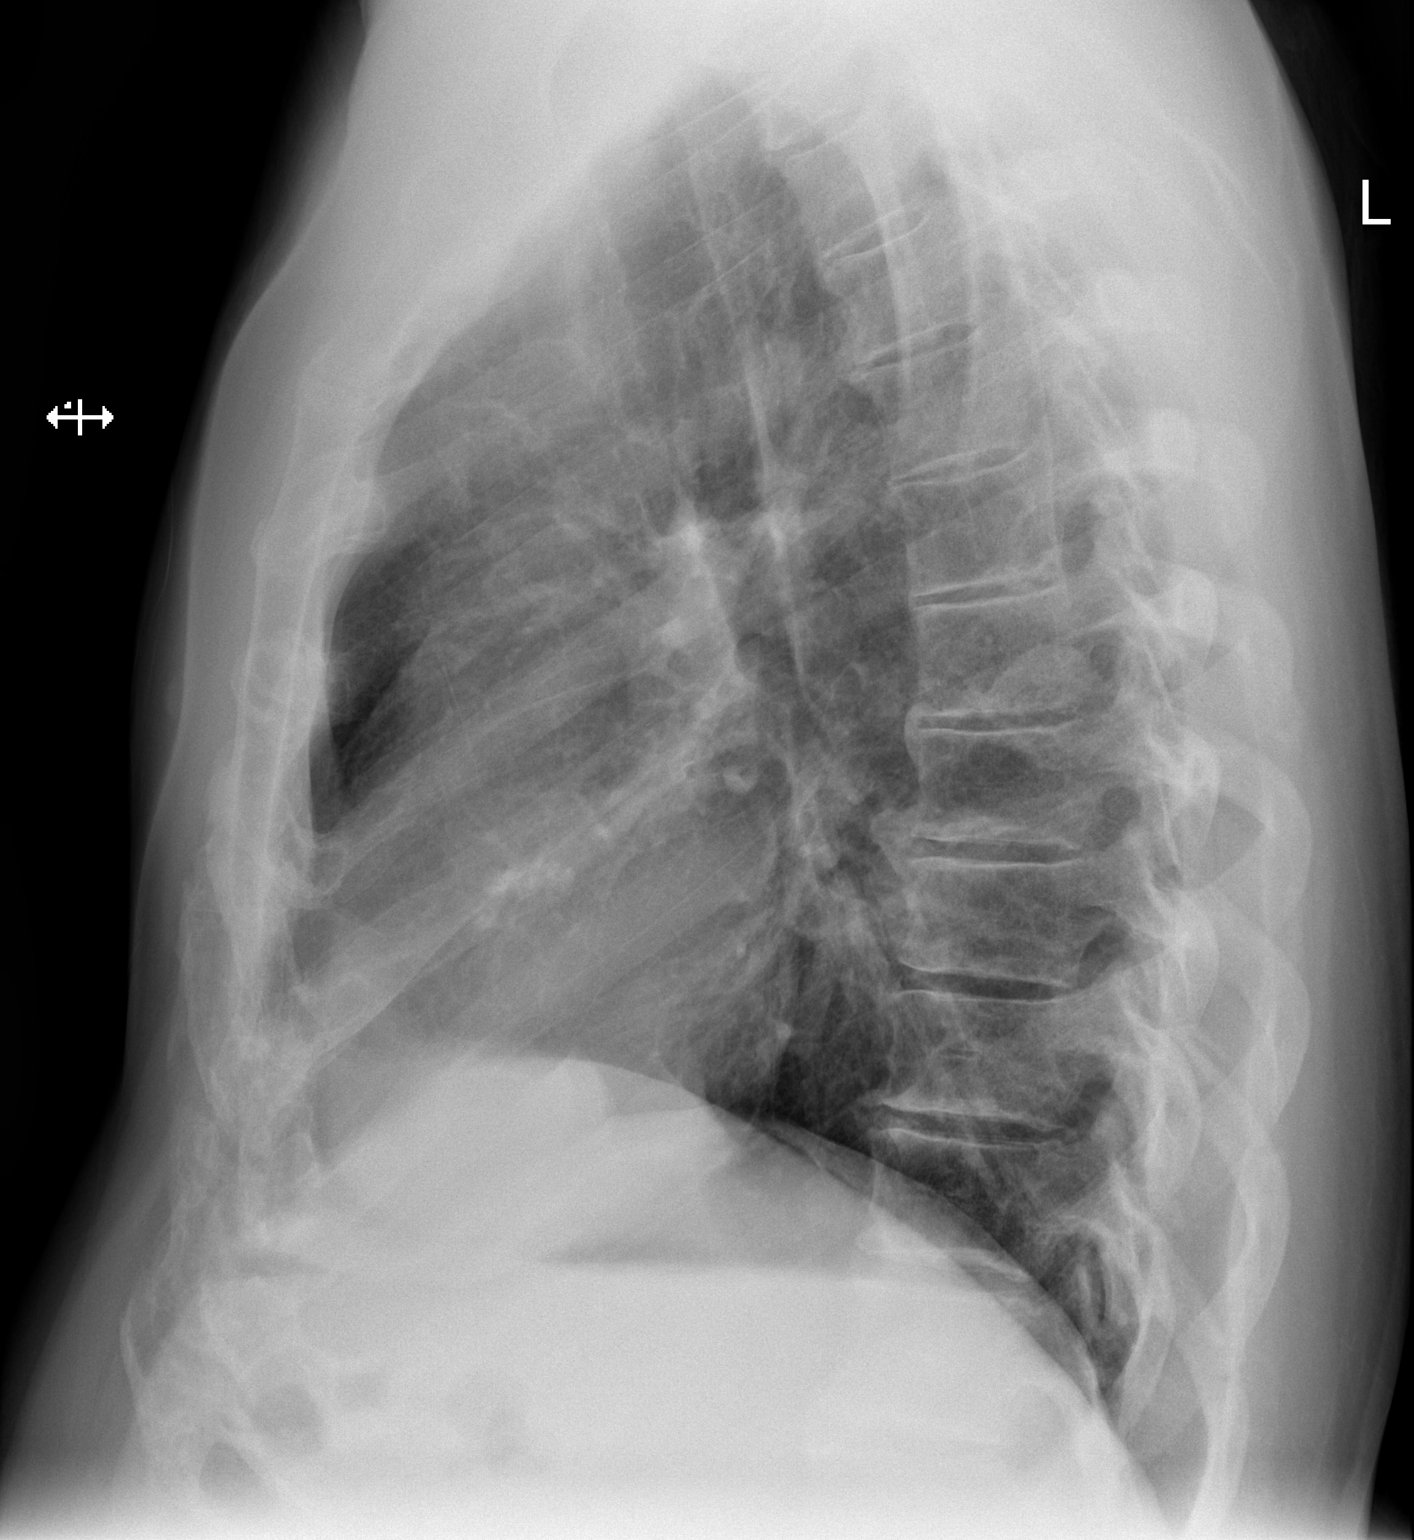

[2 of 2 positions shown; findings below may reference images not displayed]

FINDINGS: The heart size and mediastinal contours are within normal limits.
Both lungs are clear. The visualized skeletal structures are
unremarkable.
IMPRESSION: No active cardiopulmonary disease.

## 2022-03-28 IMAGING — DX DG CHEST 1V PORT
1 series · 1 of 1 positions shown · non-contrast
Comparison: 09/01/2020

CLINICAL DATA: Status post AVR

EXAM:
PORTABLE CHEST 1 VIEW

[chest]
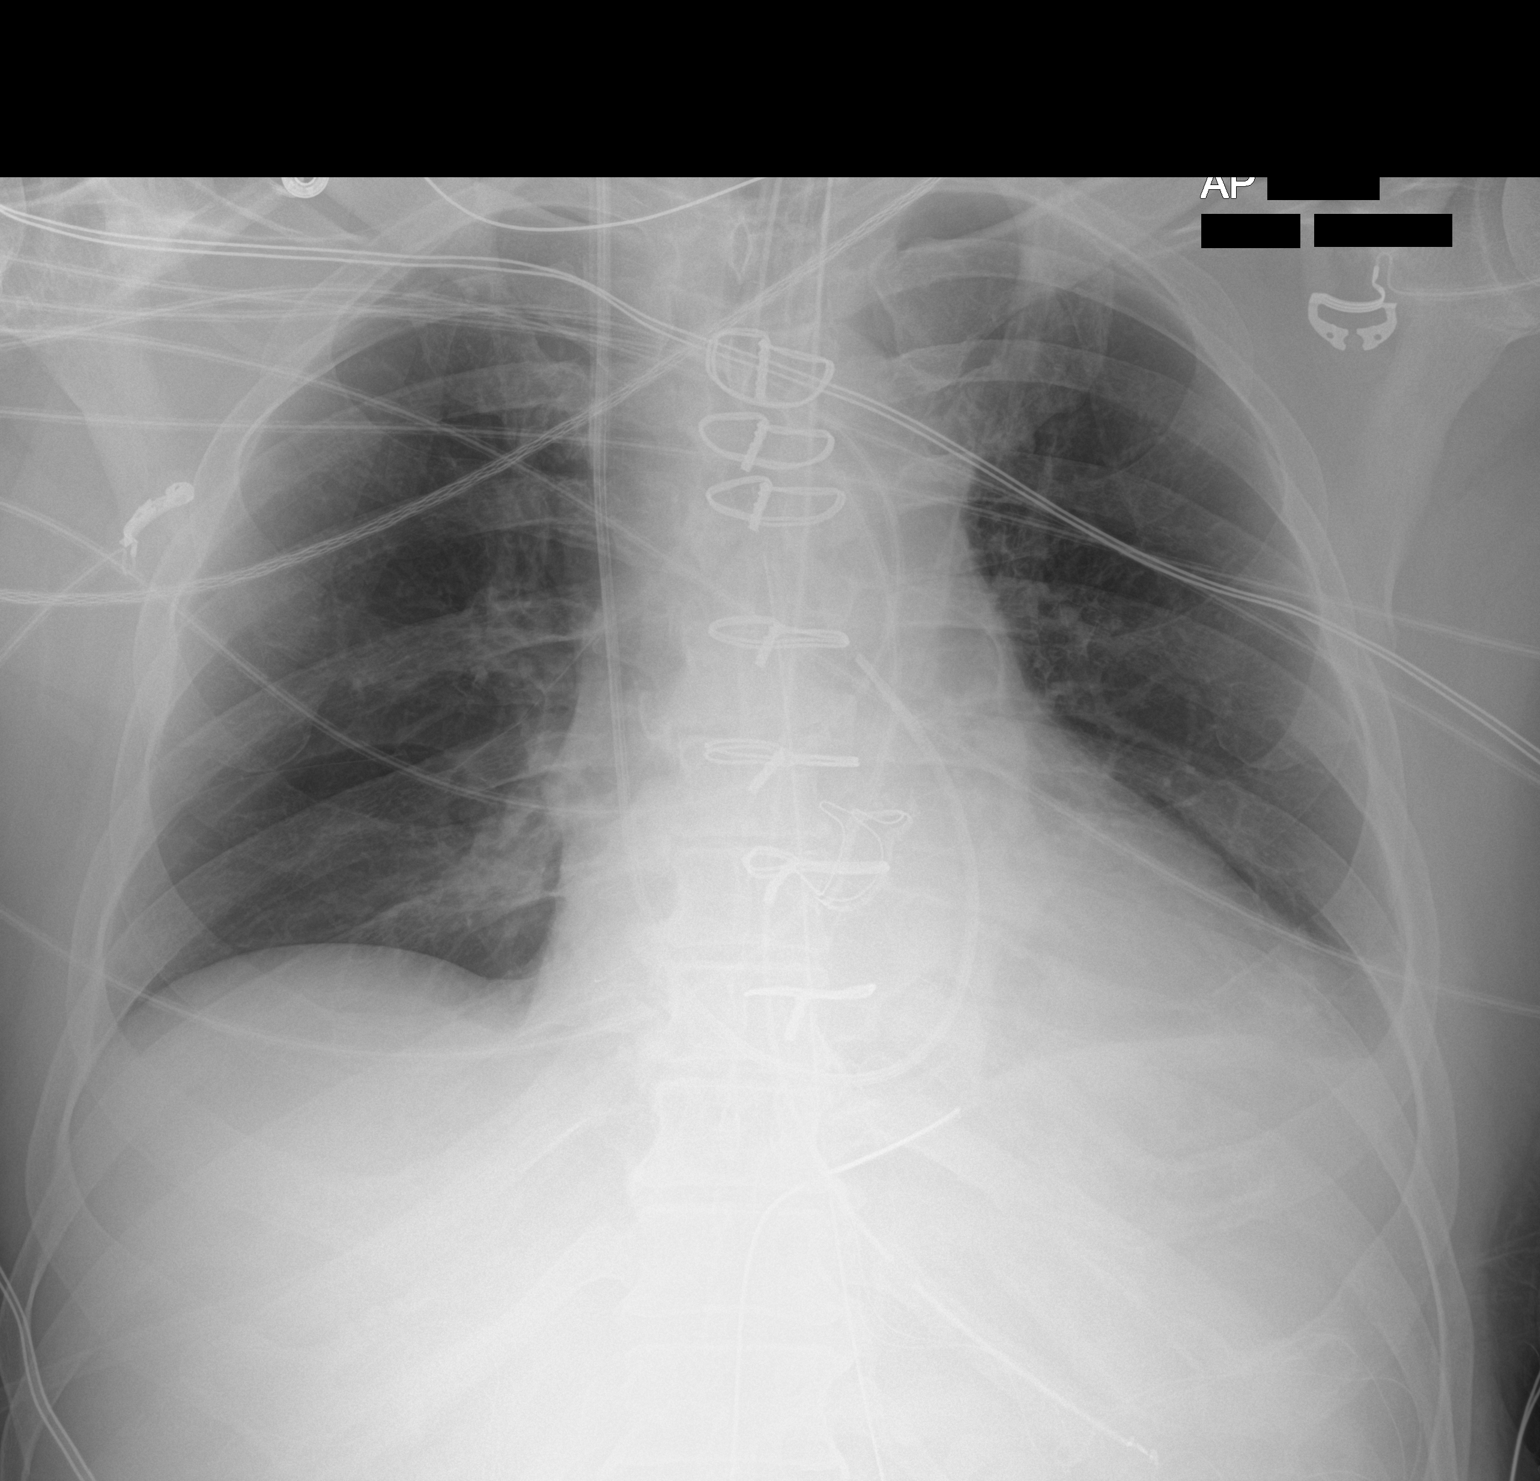

[1 of 1 positions shown; findings below may reference images not displayed]

FINDINGS: Swan-Ganz catheter with the tip projecting over the right
ventricular outflow tract. Endotracheal tube with the tip 4.5 cm
above the carina. Nasogastric tube coursing below the diaphragm.
Mediastinal drains noted. No focal consolidation, pleural effusion
or pneumothorax. Stable cardiomediastinal silhouette. Median
sternotomy and aortic valve replacement. Swan-Ganz catheter with the
tip projecting over the right ventricular outflow tract.
IMPRESSION: 1. Support lines and tubing in satisfactory position.

## 2022-03-29 IMAGING — DX DG CHEST 1V PORT
1 series · 1 of 1 positions shown · non-contrast
Comparison: Chest radiograph dated 1 day prior

CLINICAL DATA: Chest tube present status post open heart surgery,
sore chest

EXAM:
PORTABLE CHEST 1 VIEW

[chest]
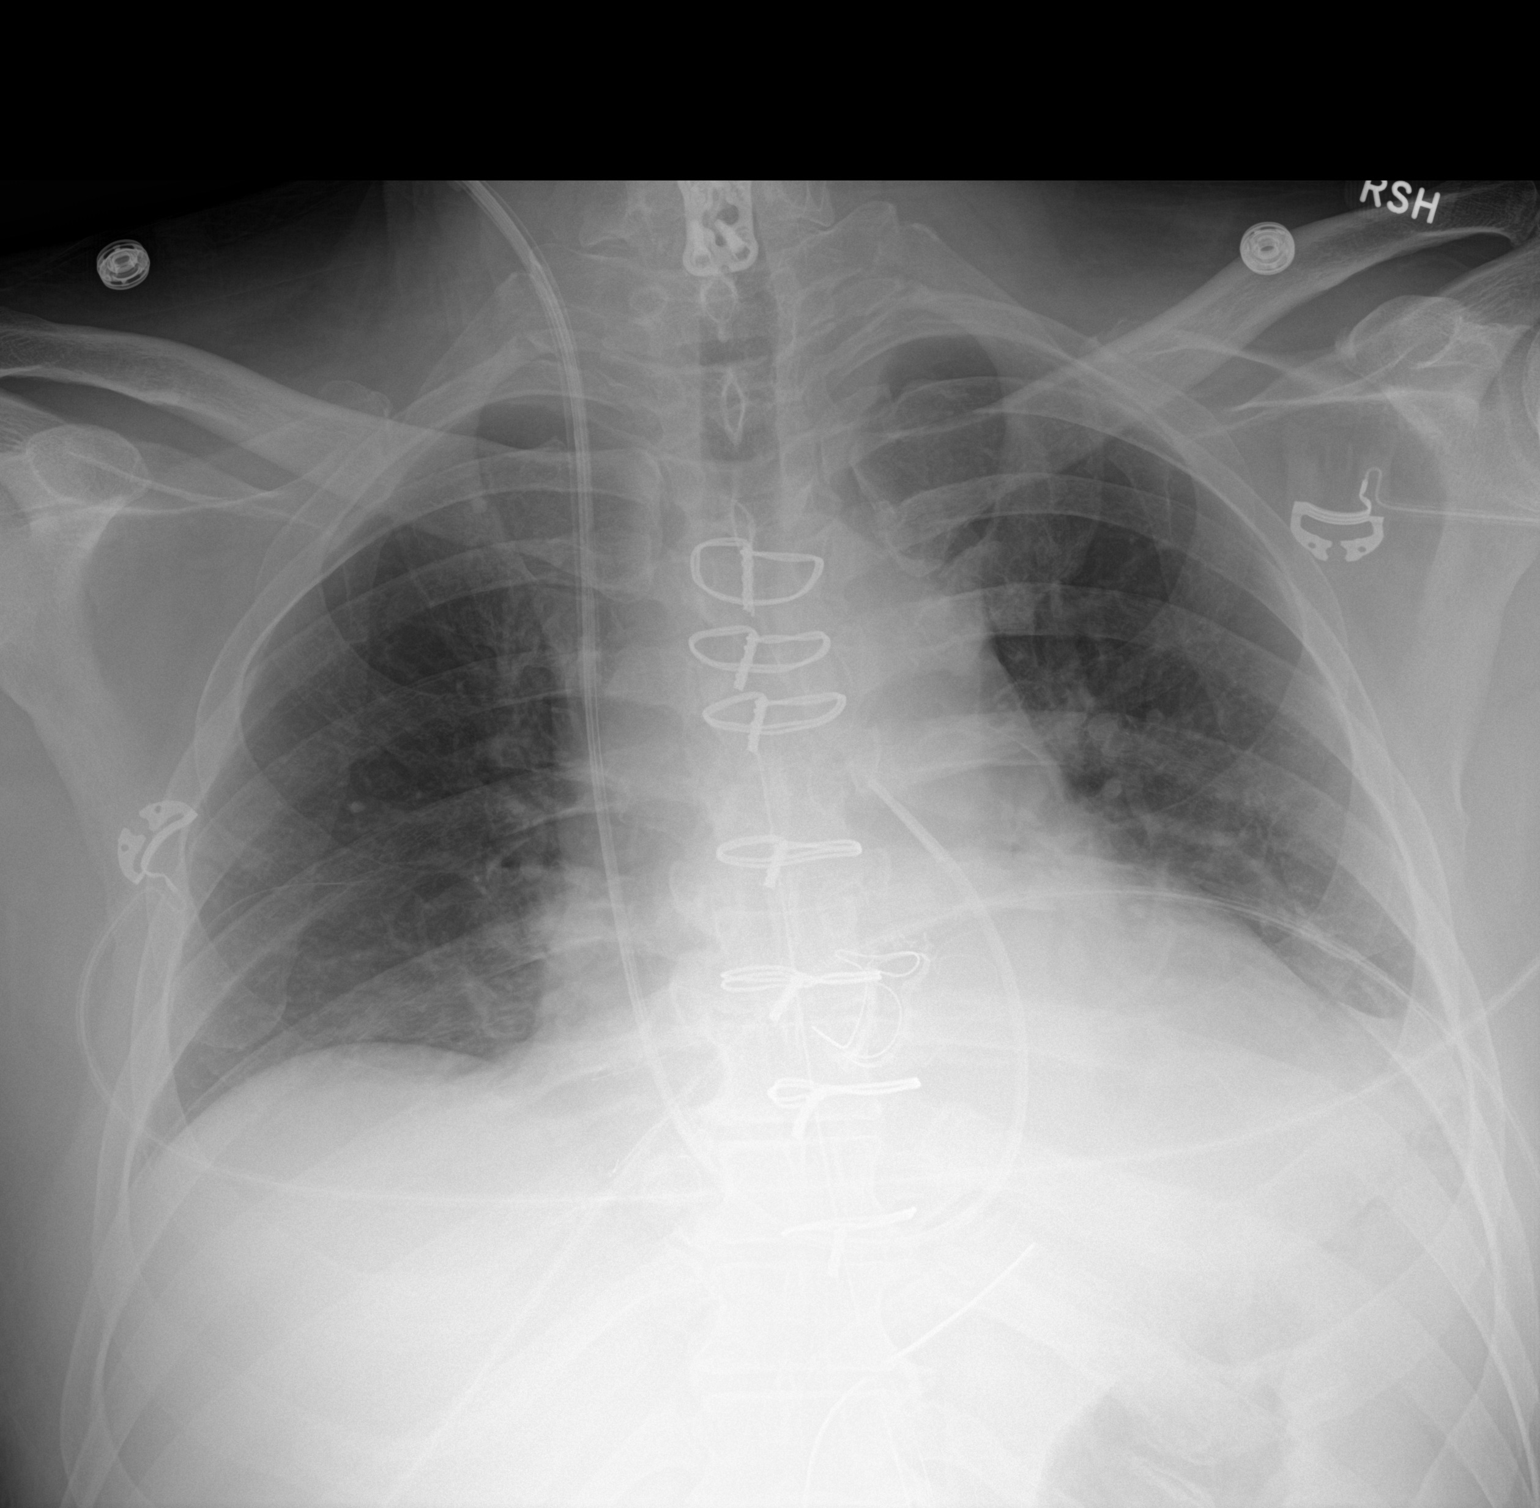

[1 of 1 positions shown; findings below may reference images not displayed]

FINDINGS: A right IJ Swan-Ganz catheter is in stable position projecting over
the expected location of the right ventricular outflow tract. The
endotracheal tube has been removed. The enteric catheter is coiled
in the stomach with the tip projecting over the stomach. Median
sternotomy wires and cardiac valve prosthesis are unchanged. A
mediastinal drain is unchanged.

The cardiomediastinal silhouette is stable.

There is a stable small left pleural effusion with adjacent airspace
disease likely reflecting subsegmental atelectasis. The right lung
is clear. There is no pneumothorax. A small nodular opacity
projecting over the right apex likely correspond to a calcified
granuloma seen on prior CT chest.

The bones are stable. Cervical spine fusion hardware is again noted.
IMPRESSION: 1. Interval extubation. Otherwise, stable position of the support
devices as above.
2. Stable small left pleural effusion with adjacent subsegmental
atelectasis.

## 2022-03-30 IMAGING — DX DG CHEST 1V PORT
1 series · 1 of 1 positions shown · non-contrast
Comparison: Portable exam 4284 hours compared to 09/02/2020

CLINICAL DATA: Post AVR

EXAM:
PORTABLE CHEST 1 VIEW

[chest ap]
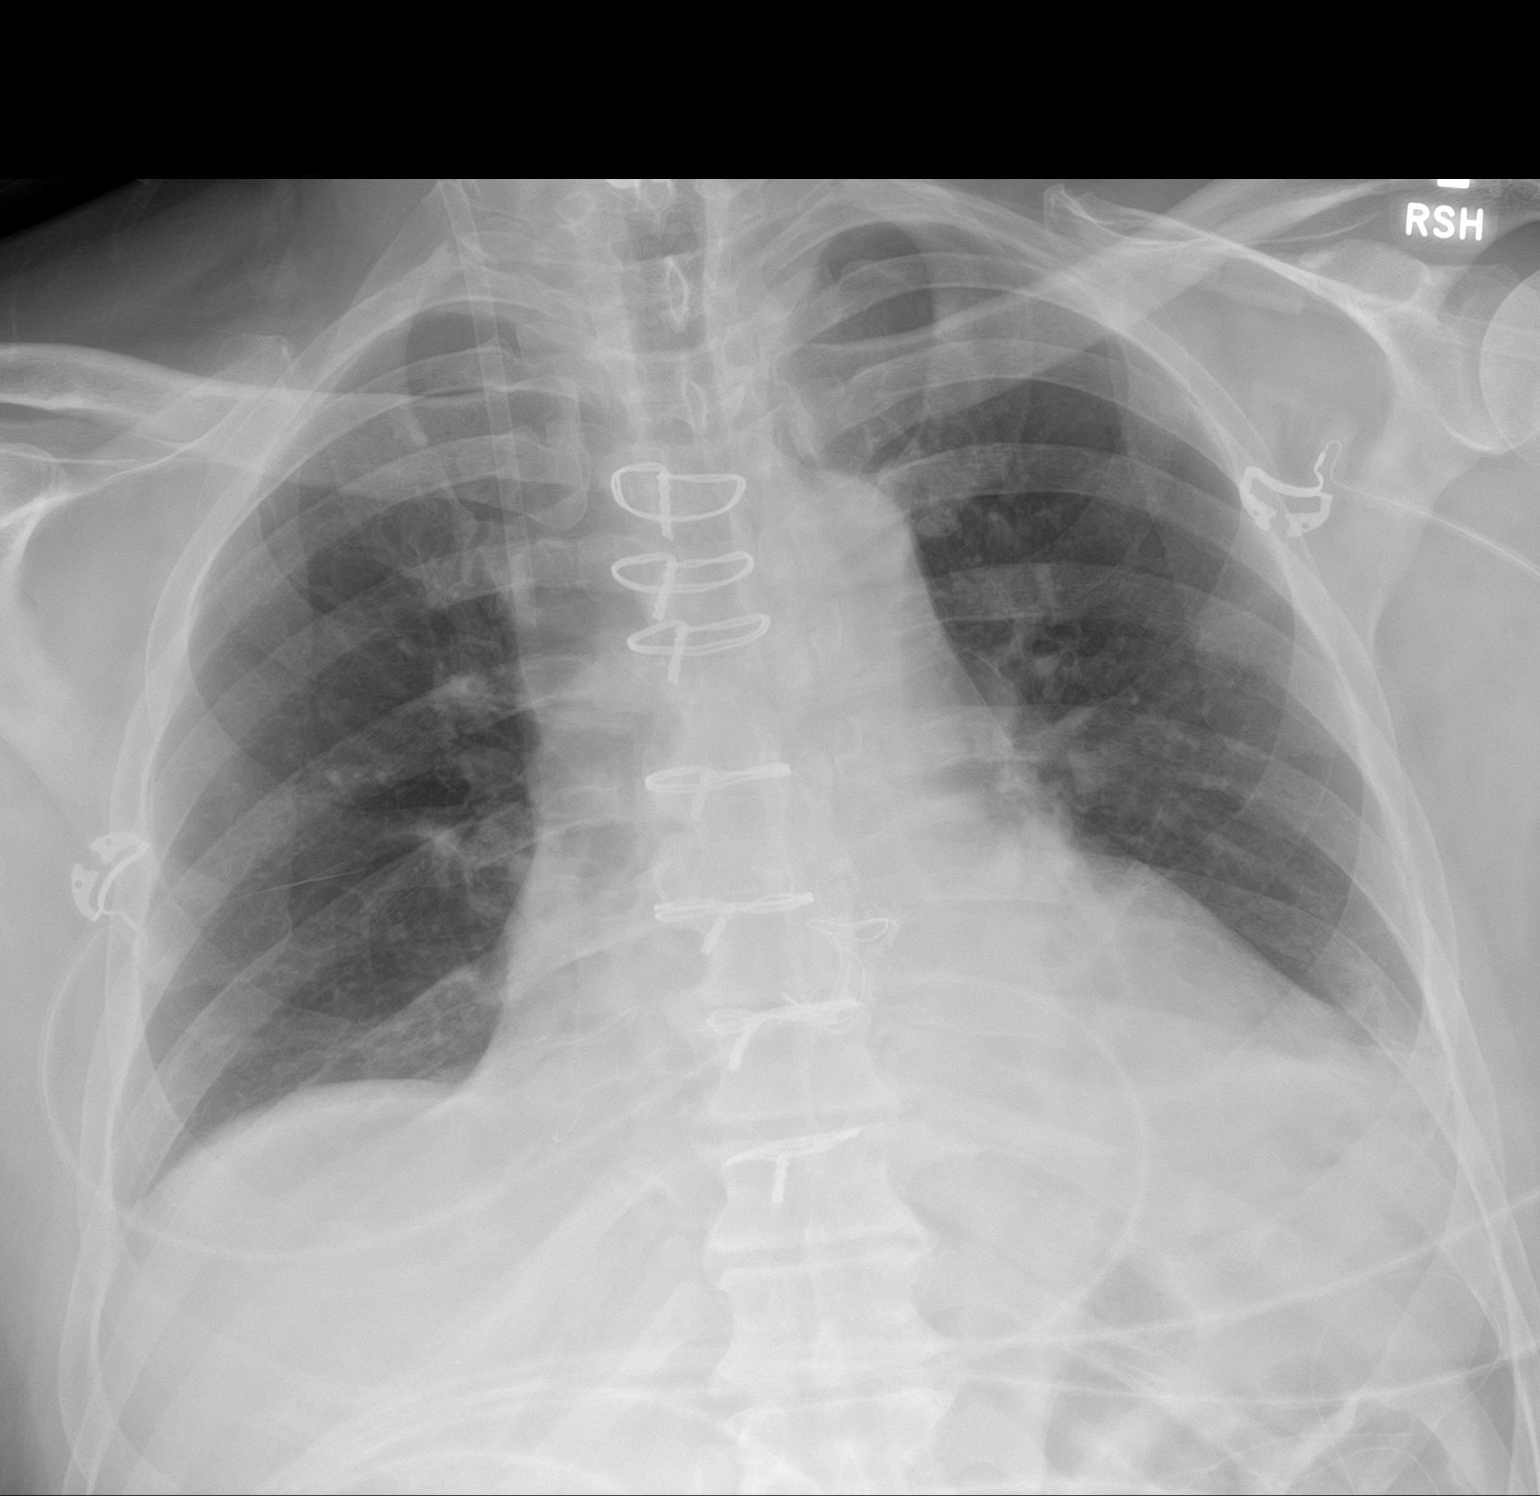

[1 of 1 positions shown; findings below may reference images not displayed]

FINDINGS: Interval removal of mediastinal drains and Swan-Ganz catheter.

RIGHT jugular catheter with tip projecting over SVC.

Enlargement of cardiac silhouette post AVR.

Mild bibasilar atelectasis, slightly improved on LEFT.

Upper lungs clear.

No pleural effusion or pneumothorax.
IMPRESSION: Mild bibasilar atelectasis.

## 2022-09-29 ENCOUNTER — Encounter: Payer: Self-pay | Admitting: Internal Medicine

## 2022-09-29 ENCOUNTER — Ambulatory Visit: Payer: 59 | Attending: Internal Medicine | Admitting: Internal Medicine

## 2022-09-29 VITALS — BP 114/80 | HR 73 | Ht 70.0 in | Wt 184.8 lb

## 2022-09-29 DIAGNOSIS — I350 Nonrheumatic aortic (valve) stenosis: Secondary | ICD-10-CM

## 2022-09-29 NOTE — Progress Notes (Signed)
Cardiology Office Note   Date:  09/29/2022   ID:  [name], DOB 16-Mar-1960, MRN [id number]  PCP:  Ozella Rocks, MD  Cardiologist:   Dietrich Pates, MD   Patient presents for follow up of AV dz     History of Present Illness: Kirk Campbell is a 62 y.o. male with a history of AV disease  He is s/p AVR on 09/01/20 for AS/AI   Kirk Campbell)  LHC prior to procedure showed no CAD   He was seen by Carleene Mains in September for episode of syncope right after surgery    I saw the pt in June 2023   Patient had a spell this summer when BP high  130s over 90s   Lasted about 1 month  Stressful year  Eased off    Otherwise the pt has done well  No CP Rare dizzinesss He does have some SOB when he walks 5 flights of stairs   Attrib to some deconditioning   Hurt back earlier this year   It has improved but he has not gone back to gym  Diet  mainly plant based     Current Meds  Medication Sig   acetaminophen (TYLENOL) 500 MG tablet Take 1,000 mg by mouth every 6 (six) hours as needed (for pain.).   amoxicillin (AMOXIL) 500 MG tablet Take 2,000 mg by mouth once. Prior to dental procedure   Apoaequorin (PREVAGEN EXTRA STRENGTH) 20 MG CAPS Take 20 mg by mouth daily.   aspirin EC 81 MG tablet Take 81 mg by mouth in the morning.   Melatonin 10 MG TABS Take 10 mg by mouth at bedtime.   Multiple Vitamin (MULTIVITAMIN WITH MINERALS) TABS tablet Take 1 tablet by mouth daily. Men's Multivitamin     Allergies:   Patient has no known allergies.   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/27/2021   GERD (gastroesophageal reflux disease)    on meds   Heart murmur 08/2020   pre aortic valve repleacement in 08/2020   Hx of adenomatous colonic polyps 04/21/2021   4 diminutive adenomas recall 2028   PONV (postoperative nausea and vomiting)    Reflux    Right inguinal hernia     Past Surgical History:  Procedure Laterality Date   ANTERIOR CERVICAL  DECOMP/DISCECTOMY FUSION N/A 12/27/2014   Procedure: C5-6, C6-7 Anterior Cervical Discectomy and Fusion, Allograft, Plate;  Surgeon: Eldred Manges, MD;  Location: [facility] OR;  Service: Orthopedics;  Laterality: N/A;   AORTIC VALVE REPLACEMENT N/A 09/01/2020   Procedure: AORTIC VALVE REPLACEMENT (AVR) USING INSPIRIS VALVE SIZE ;  Surgeon: Alleen Borne, MD;  Location: [facility] OR;  Service: Open Heart Surgery;  Laterality: N/A;   COLONOSCOPY  2011   CG-F/V-moviprep (exc)-mod tics   COLONOSCOPY WITH PROPOFOL N/A 04/20/2021   Procedure: COLONOSCOPY WITH PROPOFOL;  Surgeon: Iva Boop, MD;  Location: [facility];  Service: Endoscopy;  Laterality: N/A;   EXPLORATION POST OPERATIVE OPEN HEART N/A 09/01/2020   Procedure: EXPLORATION POST OPERATIVE OPEN HEART;  Surgeon: Alleen Borne, MD;  Location: [facility] OR;  Service: Open Heart Surgery;  Laterality: N/A;   INGUINAL HERNIA REPAIR Right    POLYPECTOMY  04/20/2021   Procedure: POLYPECTOMY;  Surgeon: Iva Boop, MD;  Location: [facility];  Service: Endoscopy;;   RIGHT/LEFT HEART CATH AND CORONARY ANGIOGRAPHY N/A 06/27/2020   Procedure: RIGHT/LEFT HEART CATH AND CORONARY ANGIOGRAPHY;  Surgeon: Kathleene Hazel,  MD;  [facility]: [facility];  Service: Cardiovascular;  Laterality: N/A;   TEE WITHOUT CARDIOVERSION N/A 09/01/2020   Procedure: TRANSESOPHAGEAL ECHOCARDIOGRAM (TEE);  Surgeon: Alleen Borne, MD;  Location: [facility] OR;  Service: Open Heart Surgery;  Laterality: N/A;     Social History:  The patient  reports that he has never smoked. He has never used smokeless tobacco. He reports current alcohol use of about 3.0 - 6.0 standard drinks of alcohol per week. He reports that he does not use drugs.   Family History:  The patient's family history includes COPD in his mother; Cancer in his sister; Diabetes in his mother.    ROS:  Please see the history of present illness. All other systems are reviewed and  Negative to the above problem  except as noted.    PHYSICAL EXAM: VS:  BP 114/80 (BP Location: Left Arm, Patient Position: Sitting, Cuff Size: Large)   Pulse 73   Ht 5\' 10"  (1.778 m)   Wt 184 lb 12.8 oz (83.8 kg)   SpO2 97%   BMI 26.52 kg/m   GEN: Well nourished, well developed, in no acute distress  HEENT: normal  Neck: no JVD, carotid bruits Cardiac: RRR; Gr I/VI systolic murmur base  No LE edema  Respiratory:  clear to auscultation bilaterally GI: soft, nontender,No hepatomegaly  MS: no deformity Moving all extremities   Skin: warm and dry, no rash Neuro:  Strength and sensation are intact Psych: euthymic mood, full affect   EKG:  EKG is ordered today.  SR 77 bpm   Echo   Nov 2022   1. Left ventricular ejection fraction by 3D volume is 54 %. The left  ventricle has low normal function. The left ventricle has no regional wall  motion abnormalities. Left ventricular diastolic parameters are consistent  with Grade I diastolic dysfunction   (impaired relaxation). The average left ventricular global longitudinal  strain is -19.8 %. The global longitudinal strain is normal.   2. Right ventricular systolic function is mildly reduced. The right  ventricular size is mildly enlarged. There is normal pulmonary artery  systolic pressure. The estimated right ventricular systolic pressure is  22.4 mmHg.   3. The mitral valve is grossly normal. Trivial mitral valve  regurgitation. No evidence of mitral stenosis.   4. The aortic valve has been repaired/replaced. Aortic valve  regurgitation is not visualized. There is a 25 mm Edwards Inspiris Resilia  valve present in the aortic position. Procedure Date: 09/01/20. Echo  findings are consistent with normal structure and   function of the aortic valve prosthesis. Aortic valve mean gradient  measures 7.0 mmHg. Aortic valve Vmax measures 1.74 m/s. Aortic valve  acceleration time measures 81 msec, DVI 0.79.   5. Aortic dilatation noted. There is mild dilatation of the  ascending  aorta, measuring 40 mm.   6. The inferior vena cava is normal in size with greater than 50%  respiratory variability, suggesting right atrial pressure of 3 mmHg.  Lipid Panel No results found for: "CHOL", "TRIG", "HDL", "CHOLHDL", "VLDL", "LDLCALC", "LDLDIRECT"    Wt Readings from Last 3 Encounters:  09/29/22 184 lb 12.8 oz (83.8 kg)  07/07/21 180 lb (81.6 kg)  04/20/21 171 lb (77.6 kg)      ASSESSMENT AND PLAN:  1  AV disease  Pt with bicuspid AV   had severe AS mod AI   Underwent AVR 09/01/20 (25 mm Edwards INSPIRIS RESILIA pericardial valve  Echo in 2022   Mean gradient 7 mm Hg   Continues to do well    Will follow up in 12 ti 18 months   2  Lipids  Last lipids in September LDL 96  HDL 49  Pt to get lipids at Dr Gwendolyn Lima office  I have asked him to send them  3  HTN  About 1 month of mild BP elevations    Pt under increased stress at time  Follow  Call back if returns   4  HCM  Reviewed diet   Good    Reviewed stress  Recs given on mindfulness strategies  Get back to exercising    Current medicines are reviewed at length with the patient today.  The patient does not have concerns regarding medicines.  Signed, Dietrich Pates, MD  09/29/2022 8:13 AM    [facility] [address] [place], [place], [place]  [address] Phone: [phone]; Fax: [phone]

## 2022-09-29 NOTE — Patient Instructions (Signed)
Medication Instructions:  Your physician recommends that you continue on your current medications as directed. Please refer to the Current Medication list given to you today.  *If you need a refill on your cardiac medications before your next appointment, please call your pharmacy*  Follow-Up: At [facility], you and your health needs are our priority.  As part of our continuing mission to provide you with exceptional heart care, we have created designated Provider Care Teams.  These Care Teams include your primary Cardiologist (physician) and Advanced Practice Providers (APPs -  Physician Assistants and Nurse Practitioners) who all work together to provide you with the care you need, when you need it.  Your next appointment:   12-18 months  Provider:   Dietrich Pates, MD

## 2022-10-19 LAB — LAB REPORT - SCANNED: EGFR: 98

## 2022-10-26 ENCOUNTER — Other Ambulatory Visit: Payer: Self-pay

## 2022-10-26 DIAGNOSIS — Z79899 Other long term (current) drug therapy: Secondary | ICD-10-CM

## 2022-10-26 DIAGNOSIS — E7849 Other hyperlipidemia: Secondary | ICD-10-CM

## 2022-10-26 MED ORDER — ROSUVASTATIN CALCIUM 10 MG PO TABS: 10.0000 mg | ORAL_TABLET | Freq: Every day | ORAL | 3 refills | Status: DC

## 2022-10-26 NOTE — Progress Notes (Signed)
nmr  

## 2023-01-05 ENCOUNTER — Ambulatory Visit: Payer: 59

## 2023-01-05 DIAGNOSIS — E7849 Other hyperlipidemia: Secondary | ICD-10-CM

## 2023-01-05 DIAGNOSIS — Z79899 Other long term (current) drug therapy: Secondary | ICD-10-CM

## 2023-01-25 LAB — NMR, LIPOPROFILE
Cholesterol, Total: 135 mg/dL (ref 100–199)
HDL Particle Number: 38.1 umol/L (ref >=30.5)
HDL-C: 52 mg/dL (ref >39)
LDL Particle Number: 883 nmol/L (ref <1000)
LDL Size: 20 nmol — ABNORMAL LOW (ref >20.5)
LDL-C (NIH Calc): 71 mg/dL (ref 0–99)
LP-IR Score: 56 — ABNORMAL HIGH (ref <=45)
Small LDL Particle Number: 549 nmol/L — ABNORMAL HIGH (ref <=527)
Triglycerides: 57 mg/dL (ref 0–149)

## 2023-01-25 LAB — HEPATIC FUNCTION PANEL
ALT: 36 [IU]/L (ref 0–44)
AST: 34 [IU]/L (ref 0–40)
Albumin: 4.6 g/dL (ref 3.9–4.9)
Alkaline Phosphatase: 80 [IU]/L (ref 44–121)
Bilirubin Total: 0.2 mg/dL (ref 0.0–1.2)
Bilirubin, Direct: 0.12 mg/dL (ref 0.00–0.40)
Total Protein: 7.1 g/dL (ref 6.0–8.5)

## 2023-05-30 ENCOUNTER — Telehealth: Payer: Self-pay | Admitting: Internal Medicine

## 2023-05-30 NOTE — Telephone Encounter (Signed)
 STAT if patient feels like he/she is going to faint   Are you dizzy, lightheaded, or faint now?  Wife not currently with patient   Have you passed out?  No, stayed in bed until 10:20 AM   Do you have any other symptoms?  Wife says yesterday patient complained of headache all day yesterday, but they didn't think anything of it because he always gets headaches. This morning patient was very dizzy when woke up. Then he developed hot flashes/sweats and became nauseous.  Have you checked your HR and BP (record if available)?  BP was 159/98, 136/78, 141/92. Doesn't feel as dizzy currently, got up and drank water when daughter went to check on him. He said he would try to get up and do some work on the computer, but wife says now he's home alone. Please advise.

## 2023-05-30 NOTE — Telephone Encounter (Signed)
 Spoke with pt, he got up this morning to go to work and got very dizzy, nauseated and sweaty. He had to lie back down. He tried about 1 hour later but was not able to get up due to the dizziness. His blood pressure during the dizziness was 140/70. He was able to get up and is feeling some better but does have lightheadedness when turning his head. His bp about 30 minutes ago was 123/79. He also reports when bending over and standing the dizziness is more than previous. Explained to patient it does sound like vertigo but will make dr Avanell Bob aware for any other recommendations.

## 2023-05-31 NOTE — Telephone Encounter (Signed)
 Left detailed message for patient (OK per DPR) with Dr. Avanell Bob' response: Episode does sound more like vertigo with changes in position of body, head WIth HA, question if sinus issues   Does not sound cardiac    Recommended reaching out to PCP if symptoms continue. Provided office number for callback if any questions.

## 2023-05-31 NOTE — Telephone Encounter (Signed)
 Episode does sound more like vertigo with changes in position of body, head WIth HA, question if sinus issues   Does not sound cardiac

## 2023-10-22 ENCOUNTER — Other Ambulatory Visit: Payer: Self-pay | Admitting: Internal Medicine

## 2023-11-15 ENCOUNTER — Other Ambulatory Visit (HOSPITAL_BASED_OUTPATIENT_CLINIC_OR_DEPARTMENT_OTHER): Payer: Self-pay

## 2023-11-15 ENCOUNTER — Ambulatory Visit (HOSPITAL_BASED_OUTPATIENT_CLINIC_OR_DEPARTMENT_OTHER)
Admission: RE | Admit: 2023-11-15 | Discharge: 2023-11-15 | Disposition: A | Discharge status: Home / Self Care | Source: Ambulatory Visit | Attending: Student | Admitting: Student

## 2023-11-15 ENCOUNTER — Encounter (HOSPITAL_BASED_OUTPATIENT_CLINIC_OR_DEPARTMENT_OTHER): Payer: Self-pay

## 2023-11-15 VITALS — BP 139/86 | HR 65 | Temp 98.6°F | Resp 20

## 2023-11-15 DIAGNOSIS — J069 Acute upper respiratory infection, unspecified: Secondary | ICD-10-CM

## 2023-11-15 LAB — POC SOFIA SARS ANTIGEN FIA: SARS Coronavirus 2 Ag: NEGATIVE

## 2023-11-15 MED ORDER — AZELASTINE HCL 0.1 % NA SOLN
2.0000 | Freq: Two times a day (BID) | NASAL | 2 refills | Status: DC
  Filled 2023-11-15: qty 30, 25d supply, fill #0

## 2023-11-15 NOTE — ED Triage Notes (Signed)
 Pt started to have a sore throat, nasal congestion, cough, and HA since yesterday. Denies fever, chills, and body aches. Pt has taken musinex with some relief.

## 2023-11-15 NOTE — Discharge Instructions (Signed)
-  Azelastine nasal spray up to twice daily for congestion -Continue Mucinex -For bodyaches, fevers: you can take Tylenol  up to 1000 mg 3 times daily, and ibuprofen up to 600 mg 3 times daily with food.  You can take these together, or alternate every 3-4 hours. -Your cough and nasal congestion should slowly get better instead of worse over the next week. If you develop a cough productive of dark or red sputum, new shortness of breath, new chest tightness, new fevers, etc - seek additional care.

## 2023-11-15 NOTE — ED Provider Notes (Signed)
 Kirk Campbell    CSN: [id number] Arrival date & time: 11/15/23  1414      History   Chief Complaint Chief Complaint  Patient presents with   Sore Throat    Sinus drainage, sneezing. - Entered by patient    HPI Kirk Campbell is a 63 y.o. male presenting on day 2 of viral syndrome. History as below.   Pt started to have a sore throat, nasal congestion, cough which is nonproductive, and HA since yesterday. Denies fever, chills, and body aches. Pt has taken mucinex with some relief. Denies known sick contact.  Denies shortness of breath, chest tightness, fevers.  HPI  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/27/2021   GERD (gastroesophageal reflux disease)    on meds   Heart murmur 08/2020   pre aortic valve repleacement in 08/2020   Hx of adenomatous colonic polyps 04/21/2021   4 diminutive adenomas recall 2028   PONV (postoperative nausea and vomiting)    Reflux    Right inguinal hernia     Patient Active Problem List   Diagnosis Date Noted   Difficult airway for intubation 04/27/2021   Hx of adenomatous colonic polyps 04/21/2021   S/P aortic valve replacement with bioprosthetic valve 09/01/2020   Severe aortic regurgitation 09/01/2020   Severe aortic insufficiency    Severe aortic stenosis 03/29/2020   S/P cervical spinal fusion 12/27/2014   Hip joint pain 10/30/2012    Past Surgical History:  Procedure Laterality Date   ANTERIOR CERVICAL DECOMP/DISCECTOMY FUSION N/A 12/27/2014   Procedure: C5-6, C6-7 Anterior Cervical Discectomy and Fusion, Allograft, Plate;  Surgeon: Oneil JAYSON Herald, MD;  Location: [facility] OR;  Service: Orthopedics;  Laterality: N/A;   AORTIC VALVE REPLACEMENT N/A 09/01/2020   Procedure: AORTIC VALVE REPLACEMENT (AVR) USING INSPIRIS VALVE SIZE ;  Surgeon: Lucas Dorise POUR, MD;  Location: [facility] OR;  Service: Open Heart Surgery;  Laterality: N/A;   COLONOSCOPY  2011   CG-F/V-moviprep  (exc)-mod tics    COLONOSCOPY WITH PROPOFOL  N/A 04/20/2021   Procedure: COLONOSCOPY WITH PROPOFOL ;  Surgeon: Avram Lupita BRAVO, MD;  Location: [facility];  Service: Endoscopy;  Laterality: N/A;   EXPLORATION POST OPERATIVE OPEN HEART N/A 09/01/2020   Procedure: EXPLORATION POST OPERATIVE OPEN HEART;  Surgeon: Lucas Dorise POUR, MD;  Location: [facility] OR;  Service: Open Heart Surgery;  Laterality: N/A;   INGUINAL HERNIA REPAIR Right    POLYPECTOMY  04/20/2021   Procedure: POLYPECTOMY;  Surgeon: Avram Lupita BRAVO, MD;  Location: [facility];  Service: Endoscopy;;   RIGHT/LEFT HEART CATH AND CORONARY ANGIOGRAPHY N/A 06/27/2020   Procedure: RIGHT/LEFT HEART CATH AND CORONARY ANGIOGRAPHY;  Surgeon: Verlin Lonni BIRCH, MD;  Location: [facility];  Service: Cardiovascular;  Laterality: N/A;   TEE WITHOUT CARDIOVERSION N/A 09/01/2020   Procedure: TRANSESOPHAGEAL ECHOCARDIOGRAM (TEE);  Surgeon: Lucas Dorise POUR, MD;  Location: [facility] OR;  Service: Open Heart Surgery;  Laterality: N/A;       Home Medications    Prior to Admission medications   Medication Sig Start Date End Date Taking? Authorizing Provider  azelastine (ASTELIN) 0.1 % nasal spray Place 2 sprays into both nostrils 2 (two) times daily. Use in each nostril as directed 11/15/23  Yes Lydiah Pong E, PA-C  acetaminophen  (TYLENOL ) 500 MG tablet Take 1,000 mg by mouth every 6 (six) hours as needed (for pain.).    [provider]  amoxicillin  (AMOXIL ) 500 MG tablet Take 2,000 mg by mouth once.  Prior to dental procedure 01/07/21   [provider]  Apoaequorin (PREVAGEN EXTRA STRENGTH) 20 MG CAPS Take 20 mg by mouth daily.    [provider]  aspirin  EC 81 MG tablet Take 81 mg by mouth in the morning.    [provider]  Melatonin 10 MG TABS Take 10 mg by mouth at bedtime.    [provider]  Multiple Vitamin (MULTIVITAMIN WITH MINERALS) TABS tablet Take 1 tablet by mouth daily. Men's Multivitamin    [provider]  rosuvastatin  (CRESTOR ) 10 MG tablet TAKE 1 TABLET BY MOUTH EVERY DAY 10/24/23   Okey Vina GAILS, MD    Family History Family History  Problem Relation Age of Onset   COPD Mother    Diabetes Mother    Cancer Sister    Heart attack Neg Hx    Hyperlipidemia Neg Hx    Hypertension Neg Hx    Colon cancer Neg Hx    Colon polyps Neg Hx    Esophageal cancer Neg Hx    Rectal cancer Neg Hx    Stomach cancer Neg Hx     Social History Social History   Tobacco Use   Smoking status: Never   Smokeless tobacco: Never  Vaping Use   Vaping status: Never Used  Substance Use Topics   Alcohol use: Yes    Alcohol/week: 3.0 - 6.0 standard drinks of alcohol    Types: 3 - 6 Standard drinks or equivalent per week   Drug use: Never     Allergies   Patient has no known allergies.   Review of Systems Review of Systems  Constitutional:  Negative for appetite change, chills and fever.  HENT:  Positive for congestion and sore throat. Negative for ear pain, rhinorrhea, sinus pressure and sinus pain.   Eyes:  Negative for redness and visual disturbance.  Respiratory:  Positive for cough. Negative for chest tightness, shortness of breath and wheezing.   Cardiovascular:  Negative for chest pain and palpitations.  Gastrointestinal:  Negative for abdominal pain, constipation, diarrhea, nausea and vomiting.  Genitourinary:  Negative for dysuria, frequency and urgency.  Musculoskeletal:  Negative for myalgias.  Neurological:  Negative for dizziness, weakness and headaches.  Psychiatric/Behavioral:  Negative for confusion.   All other systems reviewed and are negative.    Physical Exam Triage Vital Signs ED Triage Vitals  Encounter Vitals Group     BP 11/15/23 1425 139/86     Girls Systolic BP Percentile --      Girls Diastolic BP Percentile --      Boys Systolic BP Percentile --      Boys Diastolic BP Percentile --      Pulse Rate 11/15/23 1425 65     Resp 11/15/23 1425 20     Temp  11/15/23 1425 98.6 F (37 C)     Temp Source 11/15/23 1425 Oral     SpO2 11/15/23 1425 97 %     Weight --      Height --      Head Circumference --      Peak Flow --      Pain Score 11/15/23 1424 5     Pain Loc --      Pain Education --      Exclude from Growth Chart --    No data found.  Updated Vital Signs BP 139/86 (BP Location: Right Arm)   Pulse 65   Temp 98.6 F (37 C) (Oral)  Resp 20   SpO2 97%   Visual Acuity Right Eye Distance:   Left Eye Distance:   Bilateral Distance:    Right Eye Near:   Left Eye Near:    Bilateral Near:     Physical Exam Vitals reviewed.  Constitutional:      General: He is not in acute distress.    Appearance: Normal appearance. He is not ill-appearing.  HENT:     Head: Normocephalic and atraumatic.     Right Ear: Tympanic membrane, ear canal and external ear normal. No tenderness. No middle ear effusion. There is no impacted cerumen. Tympanic membrane is not perforated, erythematous, retracted or bulging.     Left Ear: Tympanic membrane, ear canal and external ear normal. No tenderness.  No middle ear effusion. There is no impacted cerumen. Tympanic membrane is not perforated, erythematous, retracted or bulging.     Nose: Nose normal. No congestion.     Mouth/Throat:     Mouth: Mucous membranes are moist.     Pharynx: Uvula midline. Posterior oropharyngeal erythema present. No oropharyngeal exudate.     Tonsils: No tonsillar exudate. 0 on the right. 0 on the left.     Comments: Posterior oropharyngeal erythema. Eyes:     Extraocular Movements: Extraocular movements intact.     Pupils: Pupils are equal, round, and reactive to light.  Cardiovascular:     Rate and Rhythm: Normal rate and regular rhythm.     Heart sounds: Normal heart sounds.  Pulmonary:     Effort: Pulmonary effort is normal.     Breath sounds: Normal breath sounds. No decreased breath sounds, wheezing, rhonchi or rales.  Abdominal:     Palpations: Abdomen is  soft.     Tenderness: There is no abdominal tenderness. There is no guarding or rebound.  Lymphadenopathy:     Cervical: No cervical adenopathy.     Right cervical: No superficial cervical adenopathy.    Left cervical: No superficial cervical adenopathy.  Neurological:     General: No focal deficit present.     Mental Status: He is alert and oriented to person, place, and time.  Psychiatric:        Mood and Affect: Mood normal.        Behavior: Behavior normal.        Thought Content: Thought content normal.        Judgment: Judgment normal.      UC Treatments / Results  Labs (all labs ordered are listed, but only abnormal results are displayed) Labs Reviewed  POC SOFIA SARS ANTIGEN FIA - Normal    EKG   Radiology No results found.  Procedures Procedures (including critical Campbell time)  Medications Ordered in UC Medications - No data to display  Initial Impression / Assessment and Plan / UC Course  I have reviewed the triage vital signs and the nursing notes.  Pertinent labs & imaging results that were available during my Campbell of the patient were reviewed by me and considered in my medical decision making (see chart for details).     Patient is a pleasant 62 year old male presenting on day 2 of viral syndrome.  He is afebrile and nontachycardic.  There are no adventitious breath sounds, and he does not have a history of pulmonary disease.  COVID negative.  Will manage symptomatically.  Azelastine nasal spray sent, and he will continue Mucinex at home.  Final Clinical Impressions(s) / UC Diagnoses   Final diagnoses:  Viral URI with cough  Discharge Instructions      -Azelastine nasal spray up to twice daily for congestion -Continue Mucinex -For bodyaches, fevers: you can take Tylenol  up to 1000 mg 3 times daily, and ibuprofen up to 600 mg 3 times daily with food.  You can take these together, or alternate every 3-4 hours. -Your cough and nasal congestion  should slowly get better instead of worse over the next week. If you develop a cough productive of dark or red sputum, new shortness of breath, new chest tightness, new fevers, etc - seek additional Campbell.     ED Prescriptions     Medication Sig Dispense Auth. Provider   azelastine (ASTELIN) 0.1 % nasal spray Place 2 sprays into both nostrils 2 (two) times daily. Use in each nostril as directed 30 mL Arlyss Leita BRAVO, PA-C      PDMP not reviewed this encounter.   Arlyss Leita BRAVO, PA-C 11/15/23 1512

## 2024-02-19 NOTE — Progress Notes (Unsigned)
 " Cardiology Office Note:    Date:  02/20/2024   ID:  Kirk Campbell, DOB 07/23/1960, MRN [id number]  PCP:  Remonia Alm PARAS, MD   [facility] Providers Cardiologist:  Vina Gull, MD     Referring MD: Remonia Alm PARAS, MD   Chief complaint: Annual follow-up of aortic valve replacement/aortic dilatation     History of Present Illness:   Kirk Campbell is a 64 y.o. male with a hx of HTN, HLD, HCM, aortic valve disease s/p replacement with pericardial tissue valve by Dr. Sherrine 09/01/2020.  Preoperative R/LHC demonstrating normal filling pressures, no CAD.  Has been doing well in the interim with occasional dizziness, uncertain whether cardiac in nature.  Most recent echo 11/2020 LVEF 54%, no RWMA, G1 DD, RV SF mildly reduced, size mildly enlarged, normal pressures, trivial MV regurg, replaced AV valve present, normal in appearance, mean gradient 7.0 mmHg, DVI 0.79, mild dilatation of the ascending aorta measuring 40 mm.  Presents independently, doing well from a cardiovascular standpoint. He denies chest pain, palpitations, dyspnea, orthopnea, n, v,  dark/tarry/bloody stools, hematuria, dizziness, syncope, edema, weight gain.  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 Tries to work out at [employer] for around an hour 2-3 days a week, no symptoms during activity.  ROS:   Please see the history of present illness.    All other systems reviewed and are negative.     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/27/2021   GERD (gastroesophageal reflux disease)    on meds   Heart murmur 08/2020   pre aortic valve repleacement in 08/2020   Hx of adenomatous colonic polyps 04/21/2021   4 diminutive adenomas recall 2028   PONV (postoperative nausea and vomiting)    Reflux    Right inguinal hernia     Past Surgical History:  Procedure Laterality Date   ANTERIOR CERVICAL DECOMP/DISCECTOMY FUSION N/A 12/27/2014    Procedure: C5-6, C6-7 Anterior Cervical Discectomy and Fusion, Allograft, Plate;  Surgeon: Oneil JAYSON Herald, MD;  Location: [facility] OR;  Service: Orthopedics;  Laterality: N/A;   AORTIC VALVE REPLACEMENT N/A 09/01/2020   Procedure: AORTIC VALVE REPLACEMENT (AVR) USING INSPIRIS VALVE SIZE ;  Surgeon: Lucas Dorise POUR, MD;  Location: [facility] OR;  Service: Open Heart Surgery;  Laterality: N/A;   COLONOSCOPY  2011   CG-F/V-moviprep  (exc)-mod tics   COLONOSCOPY WITH PROPOFOL  N/A 04/20/2021   Procedure: COLONOSCOPY WITH PROPOFOL ;  Surgeon: Avram Lupita BRAVO, MD;  Location: [facility] ENDOSCOPY;  Service: Endoscopy;  Laterality: N/A;   EXPLORATION POST OPERATIVE OPEN HEART N/A 09/01/2020   Procedure: EXPLORATION POST OPERATIVE OPEN HEART;  Surgeon: Lucas Dorise POUR, MD;  Location: [facility] OR;  Service: Open Heart Surgery;  Laterality: N/A;   INGUINAL HERNIA REPAIR Right    POLYPECTOMY  04/20/2021   Procedure: POLYPECTOMY;  Surgeon: Avram Lupita BRAVO, MD;  Location: [facility] ENDOSCOPY;  Service: Endoscopy;;   RIGHT/LEFT HEART CATH AND CORONARY ANGIOGRAPHY N/A 06/27/2020   Procedure: RIGHT/LEFT HEART CATH AND CORONARY ANGIOGRAPHY;  Surgeon: Verlin Lonni BIRCH, MD;  Location: [facility] INVASIVE CV LAB;  Service: Cardiovascular;  Laterality: N/A;   TEE WITHOUT CARDIOVERSION N/A 09/01/2020   Procedure: TRANSESOPHAGEAL ECHOCARDIOGRAM (TEE);  Surgeon: Lucas Dorise POUR, MD;  Location: [facility] OR;  Service: Open Heart Surgery;  Laterality: N/A;    Current Medications: Active Medications[1]   Allergies:   Patient has no known allergies.   Social History  Socioeconomic History   Marital status: Married    Spouse name: Not on file   Number of children: Not on file   Years of education: Not on file   Highest education level: Associate degree: occupational, scientist, product/process development, or vocational program  Occupational History   Not on file  Tobacco Use   Smoking status: Never   Smokeless tobacco: Never  Vaping Use   Vaping status: Never Used  Substance and  Sexual Activity   Alcohol use: Yes    Alcohol/week: 3.0 - 6.0 standard drinks of alcohol    Types: 3 - 6 Standard drinks or equivalent per week   Drug use: Never   Sexual activity: Not on file  Other Topics Concern   Not on file  Social History Narrative   Married   Engineer, 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   Second home [facility]   Occasional alcohol never drugs never tobacco   Social Drivers of Health   Tobacco Use: Low Risk (02/20/2024)   Patient History    Smoking Tobacco Use: Never    Smokeless Tobacco Use: Never    Passive Exposure: Not on file  Financial Resource Strain: Low Risk (02/17/2024)   Overall Financial Resource Strain (CARDIA)    Difficulty of Paying Living Expenses: Not hard at all  Food Insecurity: No Food Insecurity (02/17/2024)   Epic    Worried About Radiation Protection Practitioner of Food in the Last Year: Never true    Ran Out of Food in the Last Year: Never true  Transportation Needs: No Transportation Needs (02/17/2024)   Epic    Lack of Transportation (Medical): No    Lack of Transportation (Non-Medical): No  Physical Activity: Insufficiently Active (02/17/2024)   Exercise Vital Sign    Days of Exercise per Week: 4 days    Minutes of Exercise per Session: 30 min  Stress: Stress Concern Present (02/17/2024)   [employer] of Occupational Health - Occupational Stress Questionnaire    Feeling of Stress: Rather much  Social Connections: Moderately Integrated (02/17/2024)   Social Connection and Isolation Panel    Frequency of Communication with Friends and Family: Three times a week    Frequency of Social Gatherings with Friends and Family: Twice a week    Attends Religious Services: More than 4 times per year    Active Member of [employer] or Organizations: No    Attends Engineer, Structural: Not on file    Marital Status: Married  Depression (EYV7-0): Not on file  Alcohol Screen: Low Risk (02/17/2024)   Alcohol Screen    Last Alcohol Screening Score  (AUDIT): 2  Housing: Low Risk (02/17/2024)   Epic    Unable to Pay for Housing in the Last Year: No    Number of Times Moved in the Last Year: 0    Homeless in the Last Year: No  Utilities: Not on file  Health Literacy: Not on file     Family History: The patient's family history includes COPD in his mother; Cancer in his sister; Diabetes in his mother. There is no history of Heart attack, Hyperlipidemia, Hypertension, Colon cancer, Colon polyps, Esophageal cancer, Rectal cancer, or Stomach cancer.  EKGs/Labs/Other Studies Reviewed:    The following studies were reviewed today:  EKG Interpretation Date/Time:  Monday February 20 2024 15:19:23 EST Ventricular Rate:  67 PR Interval:  142 QRS Duration:  90 QT Interval:  418 QTC Calculation: 441 R Axis:   16  Text Interpretation: Normal sinus  rhythm Normal ECG When compared with ECG of 29-Sep-2022 08:01, Questionable change in QRS axis Confirmed by Faythe Heitzenrater [phone]) on 02/20/2024 3:30:26 PM    Recent Labs: No results found for requested labs within last 365 days.  Recent Lipid Panel No results found for: CHOL, TRIG, HDL, CHOLHDL, VLDL, LDLCALC, LDLDIRECT   Risk Assessment/Calculations:                Physical Exam:    VS:  BP 120/82   Pulse 67   Ht 5' 10 (1.778 m)   Wt 192 lb (87.1 kg)   SpO2 96%   BMI 27.55 kg/m        Wt Readings from Last 3 Encounters:  02/20/24 192 lb (87.1 kg)  09/29/22 184 lb 12.8 oz (83.8 kg)  07/07/21 180 lb (81.6 kg)     GEN:  Well nourished, well developed in no acute distress HEENT: Normal NECK:  No carotid bruits CARDIAC:  S1-S2 normal, RRR, no murmurs, rubs, gallops RESPIRATORY:  Clear to auscultation without rales, wheezing or rhonchi  MUSCULOSKELETAL:  No edema; No deformity  SKIN: Warm and dry NEUROLOGIC:  Alert and oriented x 3 PSYCHIATRIC:  Normal affect       Assessment & Plan Severe aortic stenosis S/P aortic valve replacement with  bioprosthetic valve Severe aortic insufficiency Aortic dilatation SAVR with bioprosthetic Inspiris 25 mm AVR on 09/01/2020 Echo 11/2020: LVEF 54%, no RWMA, G1 DD, RV SF mildly reduced, RV size mildly enlarged, trivial MV regurg, normal structure and function of aortic valve prosthesis, mean gradient 7 mmHg, DVI 0.79, mild aortic dilatation of the ascending aorta measuring 40 mm. Patient doing well, asymptomatic Will update echo for continued monitoring of valve and aortic dilatation Continue aspirin  EC 81 mg daily, platelets 09/2023: 207 Other hyperlipidemia LDL performed at PCPs office 09/2023: 61 Continue rosuvastatin  20 mg daily  Disposition: Follow-up in 1 year or sooner if needed.  Proceed to the ED with any new or worsening symptoms.            Medication Adjustments/Labs and Tests Ordered: Current medicines are reviewed at length with the patient today.  Concerns regarding medicines are outlined above.  Orders Placed This Encounter  Procedures   EKG 12-Lead   ECHOCARDIOGRAM COMPLETE   No orders of the defined types were placed in this encounter.   Patient Instructions  Medication Instructions:  No medication changes were made during today's visit.  *If you need a refill on your cardiac medications before your next appointment, please call your pharmacy*  Lab Work: No labs were ordered during today's visit.  If you have labs (blood work) drawn today and your tests are completely normal, you will receive your results only by: MyChart Message (if you have MyChart) OR A paper copy in the mail If you have any lab test that is abnormal or we need to change your treatment, we will call you to review the results.  Testing/Procedures: Your physician has requested that you have an echocardiogram. Echocardiography is a painless test that uses sound waves to create images of your heart. It provides your doctor with information about the size and shape of your heart and how well  your hearts chambers and valves are working. This procedure takes approximately one hour. There are no restrictions for this procedure. Please do NOT wear cologne, perfume, aftershave, or lotions (deodorant is allowed). Please arrive 15 minutes prior to your appointment time.  Please note: We ask at that you not bring  children with you during ultrasound (echo/ vascular) testing. Due to room size and safety concerns, children are not allowed in the ultrasound rooms during exams. Our front office staff cannot provide observation of children in our lobby area while testing is being conducted. An adult accompanying a patient to their appointment will only be allowed in the ultrasound room at the discretion of the ultrasound technician under special circumstances. We apologize for any inconvenience.   Follow-Up: At [facility], you and your health needs are our priority.  As part of our continuing mission to provide you with exceptional heart care, our providers are all part of one team.  This team includes your primary Cardiologist (physician) and Advanced Practice Providers or APPs (Physician Assistants and Nurse Practitioners) who all work together to provide you with the care you need, when you need it.  Your next appointment:   1 year(s)  Provider:   Vina Gull, MD    We recommend signing up for the patient portal called MyChart.  Sign up information is provided on this After Visit Summary.  MyChart is used to connect with patients for Virtual Visits (Telemedicine).  Patients are able to view lab/test results, encounter notes, upcoming appointments, etc.  Non-urgent messages can be sent to your provider as well.   To learn more about what you can do with MyChart, go to [email or web address].   Other Instructions            Signed, Miriam FORBES Shams, NP  02/20/2024 8:02 PM    [facility]     [1]  Current Meds  Medication Sig   acetaminophen  (TYLENOL ) 500 MG  tablet Take 1,000 mg by mouth every 6 (six) hours as needed (for pain.).   amoxicillin  (AMOXIL ) 500 MG tablet Take 2,000 mg by mouth once. Prior to dental procedure   Apoaequorin (PREVAGEN EXTRA STRENGTH) 20 MG CAPS Take 20 mg by mouth daily.   aspirin  EC 81 MG tablet Take 81 mg by mouth in the morning.   Melatonin 10 MG TABS Take 10 mg by mouth at bedtime.   Multiple Vitamin (MULTIVITAMIN WITH MINERALS) TABS tablet Take 1 tablet by mouth daily. Men's Multivitamin   rosuvastatin  (CRESTOR ) 10 MG tablet TAKE 1 TABLET BY MOUTH EVERY DAY   "

## 2024-02-20 ENCOUNTER — Encounter: Payer: Self-pay | Admitting: Emergency Medicine

## 2024-02-20 ENCOUNTER — Ambulatory Visit: Admitting: Emergency Medicine

## 2024-02-20 VITALS — BP 120/82 | HR 67 | Ht 70.0 in | Wt 192.0 lb

## 2024-02-20 DIAGNOSIS — Z953 Presence of xenogenic heart valve: Secondary | ICD-10-CM | POA: Diagnosis not present

## 2024-02-20 DIAGNOSIS — E7849 Other hyperlipidemia: Secondary | ICD-10-CM

## 2024-02-20 DIAGNOSIS — I77819 Aortic ectasia, unspecified site: Secondary | ICD-10-CM

## 2024-02-20 DIAGNOSIS — I351 Nonrheumatic aortic (valve) insufficiency: Secondary | ICD-10-CM | POA: Diagnosis not present

## 2024-02-20 DIAGNOSIS — I350 Nonrheumatic aortic (valve) stenosis: Secondary | ICD-10-CM

## 2024-02-20 NOTE — Assessment & Plan Note (Signed)
 SAVR with bioprosthetic Inspiris 25 mm AVR on 09/01/2020 Echo 11/2020: LVEF 54%, no RWMA, G1 DD, RV SF mildly reduced, RV size mildly enlarged, trivial MV regurg, normal structure and function of aortic valve prosthesis, mean gradient 7 mmHg, DVI 0.79, mild aortic dilatation of the ascending aorta measuring 40 mm. Patient doing well, asymptomatic Will update echo for continued monitoring of valve and aortic dilatation Continue aspirin  EC 81 mg daily, platelets 09/2023: 207

## 2024-03-30 ENCOUNTER — Ambulatory Visit (HOSPITAL_COMMUNITY)
